# Patient Record
Sex: Male | Born: 1995 | Race: Black or African American | Hispanic: No | Marital: Married | Smoking: Never smoker
Health system: Southern US, Community
[De-identification: ages and names within clinical notes are randomized; demographics above are authoritative.]

## PROBLEM LIST (undated history)

## (undated) DIAGNOSIS — E669 Obesity, unspecified: Secondary | ICD-10-CM

---

## 2017-03-29 ENCOUNTER — Encounter (HOSPITAL_BASED_OUTPATIENT_CLINIC_OR_DEPARTMENT_OTHER): Payer: Self-pay | Admitting: *Deleted

## 2017-03-29 ENCOUNTER — Emergency Department (HOSPITAL_BASED_OUTPATIENT_CLINIC_OR_DEPARTMENT_OTHER)
Admission: EM | Admit: 2017-03-29 | Discharge: 2017-03-29 | Disposition: A | Discharge status: Home / Self Care | Payer: Self-pay | Attending: Emergency Medicine | Admitting: Emergency Medicine

## 2017-03-29 DIAGNOSIS — J0110 Acute frontal sinusitis, unspecified: Secondary | ICD-10-CM | POA: Insufficient documentation

## 2017-03-29 MED ORDER — ACETAMINOPHEN 500 MG PO TABS
1000.0000 mg | ORAL_TABLET | Freq: Once | ORAL | Status: AC
  Administered 2017-03-29: 1000 mg via ORAL
  Filled 2017-03-29: qty 2

## 2017-03-29 MED ORDER — AMOXICILLIN-POT CLAVULANATE 875-125 MG PO TABS: 1.0000 | ORAL_TABLET | Freq: Two times a day (BID) | ORAL | 0 refills | Status: DC

## 2017-03-29 MED FILL — AMOX-CLAV 875-125 MG TABLET: 875-125 | 7 days supply | Qty: 14 | Fill #0

## 2017-03-29 NOTE — ED Provider Notes (Signed)
[facility] Provider Note   CSN: [id number] Arrival date & time: 03/29/17  1606  By signing my name below, I, Linna Darner, attest that this documentation has been prepared under the direction and in the presence of Lilleigh Hechavarria, PA-C. Electronically Signed: Linna Darner, Scribe. 03/29/2017. 4:33 PM.  History   Chief Complaint Chief Complaint  Patient presents with  . Facial Pain  . Generalized Body Aches   The history is provided by the patient. No language interpreter was used.    HPI Comments: Frank Knight is a 21 y.o. male with a reported PMHx of seasonal allergies who presents to the Emergency Department complaining of intermittent headaches and malaise beginning four days ago. He reports some associated sinus pain/pressure, right ear pain, back pain, bilateral hip pain, intermittent left-sided chest pain radiating into the LUQ, nausea without vomiting, and an occasional cough. Girlfriend reports that patient "felt hot" earlier today but patient explicitly denies any fevers or chills. His chest pain is worse with movement of his left arm and palpation of his left chest. Patient has tried Tylenol, ibuprofen, OTC allergy relief, Dayquil, and Benadryl without any improvement of his symptoms. He does note that sleeping improves his symptoms mildly. Patient has been having normal bowel movements. He has been hydrating well. No known ill contacts with similar symptoms. He is a non-smoker and drinks alcohol occasionally. Denies illicit drug use. No daily medications. no recent antibiotic use. He denies neck stiffness/pain, rashes, abd pain, dysuria, hematuria, urinary frequency, or any other associated symptoms. No PCP.  History reviewed. No pertinent past medical history.  There are no active problems to display for this patient.   History reviewed. No pertinent surgical history.     Home Medications    Prior to Admission medications   Medication Sig Start Date End  Date Taking? Authorizing Provider  amoxicillin-clavulanate (AUGMENTIN) 875-125 MG tablet Take 1 tablet by mouth every 12 (twelve) hours. 03/29/17   Sidnie Swalley, PA-C    Family History No family history on file.  Social History Social History  Substance Use Topics  . Smoking status: Never Smoker  . Smokeless tobacco: Never Used  . Alcohol use No     Allergies   Patient has no known allergies.   Review of Systems Review of Systems  Constitutional: Positive for fatigue. Negative for chills and fever.  HENT: Positive for ear pain (R), sinus pain and sinus pressure.   Respiratory: Positive for cough.   Cardiovascular: Positive for chest pain.  Gastrointestinal: Positive for nausea. Negative for constipation, diarrhea and vomiting.  Genitourinary: Negative for dysuria, frequency and hematuria.  Musculoskeletal: Positive for back pain and myalgias.  Skin: Negative for rash.  Neurological: Positive for headaches.   Physical Exam Updated Vital Signs BP 133/66   Pulse (!) 101   Temp 100.3 F (37.9 C) (Oral)   Resp 20   Ht 5' 10.5" (1.791 m)   Wt (!) 142.9 kg (315 lb)   SpO2 97%   BMI 44.56 kg/m   Physical Exam  Constitutional: He is oriented to person, place, and time. He appears well-developed and well-nourished. No distress.  HENT:  Head: Normocephalic and atraumatic.  Right Ear: Tympanic membrane normal.  Left Ear: Tympanic membrane normal.  Nose: Right sinus exhibits maxillary sinus tenderness and frontal sinus tenderness. Left sinus exhibits maxillary sinus tenderness and frontal sinus tenderness.  Mouth/Throat: Uvula is midline, oropharynx is clear and moist and mucous membranes are normal.  Eyes: Conjunctivae and EOM are normal.  Neck: Neck supple. No tracheal deviation present.  Cardiovascular: Normal rate and regular rhythm.   Pulmonary/Chest: Effort normal and breath sounds normal. No respiratory distress. He exhibits tenderness.  TTP of the left side  torso from clavicle to umbilicus. No tenderness elsewhere.  Abdominal: Soft. There is no tenderness.  Musculoskeletal: Normal range of motion.  Neurological: He is alert and oriented to person, place, and time.  Skin: Skin is warm and dry.  Psychiatric: He has a normal mood and affect. His behavior is normal.  Nursing note and vitals reviewed.  ED Treatments / Results  Labs (all labs ordered are listed, but only abnormal results are displayed) Labs Reviewed - No data to display  EKG  EKG Interpretation None       Radiology No results found.  Procedures Procedures (including critical care time)  DIAGNOSTIC STUDIES: Oxygen Saturation is 97% on RA, normal by my interpretation.    COORDINATION OF CARE: 4:33 PM Discussed treatment plan with pt at bedside and pt agreed to plan.  Medications Ordered in ED Medications  acetaminophen (TYLENOL) tablet 1,000 mg (1,000 mg Oral Given 03/29/17 1620)     Initial Impression / Assessment and Plan / ED Course  I have reviewed the triage vital signs and the nursing notes.  Pertinent labs & imaging results that were available during my care of the patient were reviewed by me and considered in my medical decision making (see chart for details).     Pt presenting with 4 day h/o cough, nasal congestion, and sinus pressure. Fevers began yesterday. Pt with body aches and L sided torso pain. Physical exam consistent with history. No signs of neck stiffness, rash, OP/tonsiallr exudate, or abnormal lung sounds. Torso pain likely due to coughing and fever. Tenderness extends from torso to umbilicus, as such it is unlikely to be a cardiac or abd process. Pt with sinusitis. Considering new fevers and body aches, will rx abx. Pt appears safe for discharge. Return precautions given. Pt states he understands and agrees to plan.   Final Clinical Impressions(s) / ED Diagnoses   Final diagnoses:  Acute non-recurrent frontal sinusitis    New  Prescriptions Discharge Medication List as of 03/29/2017  4:40 PM    START taking these medications   Details  amoxicillin-clavulanate (AUGMENTIN) 875-125 MG tablet Take 1 tablet by mouth every 12 (twelve) hours., Starting Tue 03/29/2017, Print       I personally performed the services described in this documentation, which was scribed in my presence. The recorded information has been reviewed and is accurate.    Alveria ApleyCaccavale, Aleasha Fregeau, PA-C 03/30/17 [id number]    Pricilla LovelessGoldston, Scott, MD 03/30/17 [phone]

## 2017-03-29 NOTE — ED Triage Notes (Signed)
Facial pressure, cough and body aches x 4 days.

## 2017-03-29 NOTE — Discharge Instructions (Signed)
Take antibiotics as prescribed. You may use Tylenol or ibuprofen as needed for fever and body aches. Remain well hydrated. You may follow-up with [facility] in one week if your symptoms are not improving. Return to the emergency department if you develop worsening symptoms, rash, neck stiff necks, or any new or worsening symptoms.

## 2020-06-09 ENCOUNTER — Inpatient Hospital Stay (HOSPITAL_BASED_OUTPATIENT_CLINIC_OR_DEPARTMENT_OTHER)
Admission: EM | Admit: 2020-06-09 | Discharge: 2020-06-14 | DRG: 871 | Disposition: A | Discharge status: Home / Self Care | Payer: HRSA Program | Attending: Internal Medicine | Admitting: Internal Medicine

## 2020-06-09 ENCOUNTER — Other Ambulatory Visit: Payer: Self-pay

## 2020-06-09 ENCOUNTER — Emergency Department (HOSPITAL_BASED_OUTPATIENT_CLINIC_OR_DEPARTMENT_OTHER): Payer: Self-pay

## 2020-06-09 ENCOUNTER — Encounter (HOSPITAL_BASED_OUTPATIENT_CLINIC_OR_DEPARTMENT_OTHER): Payer: Self-pay | Admitting: Emergency Medicine

## 2020-06-09 DIAGNOSIS — U071 COVID-19: Secondary | ICD-10-CM

## 2020-06-09 DIAGNOSIS — R0602 Shortness of breath: Secondary | ICD-10-CM

## 2020-06-09 DIAGNOSIS — J1282 Pneumonia due to coronavirus disease 2019: Secondary | ICD-10-CM | POA: Diagnosis present

## 2020-06-09 DIAGNOSIS — A4189 Other specified sepsis: Principal | ICD-10-CM | POA: Diagnosis present

## 2020-06-09 DIAGNOSIS — E662 Morbid (severe) obesity with alveolar hypoventilation: Secondary | ICD-10-CM | POA: Diagnosis present

## 2020-06-09 DIAGNOSIS — Z6841 Body Mass Index (BMI) 40.0 and over, adult: Secondary | ICD-10-CM

## 2020-06-09 DIAGNOSIS — J440 Chronic obstructive pulmonary disease with acute lower respiratory infection: Secondary | ICD-10-CM | POA: Diagnosis present

## 2020-06-09 DIAGNOSIS — R531 Weakness: Secondary | ICD-10-CM

## 2020-06-09 DIAGNOSIS — J9601 Acute respiratory failure with hypoxia: Secondary | ICD-10-CM

## 2020-06-09 DIAGNOSIS — I10 Essential (primary) hypertension: Secondary | ICD-10-CM | POA: Diagnosis present

## 2020-06-09 HISTORY — DX: Obesity, unspecified: E66.9

## 2020-06-09 LAB — CBC WITH DIFFERENTIAL/PLATELET
Abs Immature Granulocytes: 0.03 10*3/uL (ref 0.00–0.07)
Basophils Absolute: 0 10*3/uL (ref 0.0–0.1)
Basophils Relative: 0 %
Eosinophils Absolute: 0 10*3/uL (ref 0.0–0.5)
Eosinophils Relative: 0 %
HCT: 44.4 % (ref 39.0–52.0)
Hemoglobin: 14.5 g/dL (ref 13.0–17.0)
Immature Granulocytes: 0 %
Lymphocytes Relative: 16 %
Lymphs Abs: 1.3 10*3/uL (ref 0.7–4.0)
MCH: 28.7 pg (ref 26.0–34.0)
MCHC: 32.7 g/dL (ref 30.0–36.0)
MCV: 87.7 fL (ref 80.0–100.0)
Monocytes Absolute: 0.4 10*3/uL (ref 0.1–1.0)
Monocytes Relative: 4 %
Neutro Abs: 6.6 10*3/uL (ref 1.7–7.7)
Neutrophils Relative %: 80 %
Platelets: 238 10*3/uL (ref 150–400)
RBC: 5.06 MIL/uL (ref 4.22–5.81)
RDW: 13.2 % (ref 11.5–15.5)
WBC: 8.3 10*3/uL (ref 4.0–10.5)
nRBC: 0 % (ref 0.0–0.2)

## 2020-06-09 LAB — COMPREHENSIVE METABOLIC PANEL
ALT: 45 U/L — ABNORMAL HIGH (ref 0–44)
AST: 47 U/L — ABNORMAL HIGH (ref 15–41)
Albumin: 3.9 g/dL (ref 3.5–5.0)
Alkaline Phosphatase: 121 U/L (ref 38–126)
Anion gap: 11 (ref 5–15)
BUN: 9 mg/dL (ref 6–20)
CO2: 26 mmol/L (ref 22–32)
Calcium: 8.8 mg/dL — ABNORMAL LOW (ref 8.9–10.3)
Chloride: 100 mmol/L (ref 98–111)
Creatinine, Ser: 1.33 mg/dL — ABNORMAL HIGH (ref 0.61–1.24)
GFR calc Af Amer: >60 mL/min (ref >60)
GFR calc non Af Amer: >60 mL/min (ref >60)
Glucose, Bld: 97 mg/dL (ref 70–99)
Potassium: 3.9 mmol/L (ref 3.5–5.1)
Sodium: 137 mmol/L (ref 135–145)
Total Bilirubin: 0.5 mg/dL (ref 0.3–1.2)
Total Protein: 8.4 g/dL — ABNORMAL HIGH (ref 6.5–8.1)

## 2020-06-09 LAB — D-DIMER, QUANTITATIVE: D-Dimer, Quant: 0.63 ug/mL-FEU — ABNORMAL HIGH (ref 0.00–0.50)

## 2020-06-09 LAB — PROTIME-INR
INR: 1 (ref 0.8–1.2)
Prothrombin Time: 12.5 seconds (ref 11.4–15.2)

## 2020-06-09 LAB — CBG MONITORING, ED: Glucose-Capillary: 103 mg/dL — ABNORMAL HIGH (ref 70–99)

## 2020-06-09 LAB — RESPIRATORY PANEL BY RT PCR (FLU A&B, COVID)
Influenza A by PCR: NEGATIVE
Influenza B by PCR: NEGATIVE
SARS Coronavirus 2 by RT PCR: POSITIVE — AB

## 2020-06-09 LAB — BRAIN NATRIURETIC PEPTIDE: B Natriuretic Peptide: 17.6 pg/mL (ref 0.0–100.0)

## 2020-06-09 LAB — LACTIC ACID, PLASMA: Lactic Acid, Venous: 1.3 mmol/L (ref 0.5–1.9)

## 2020-06-09 LAB — TROPONIN I (HIGH SENSITIVITY): Troponin I (High Sensitivity): 11 ng/L (ref <18)

## 2020-06-09 MED ORDER — SODIUM CHLORIDE 0.9 % IV SOLN
200.0000 mg | Freq: Once | INTRAVENOUS | Status: DC
  Filled 2020-06-09: qty 40

## 2020-06-09 MED ORDER — SODIUM CHLORIDE 0.9 % IV SOLN
100.0000 mg | Freq: Every day | INTRAVENOUS | Status: DC
  Administered 2020-06-10: 100 mg via INTRAVENOUS
  Filled 2020-06-09: qty 20

## 2020-06-09 MED ORDER — ENOXAPARIN SODIUM 40 MG/0.4ML ~~LOC~~ SOLN: 40.0000 mg | SUBCUTANEOUS | Status: DC

## 2020-06-09 MED ORDER — IBUPROFEN 200 MG PO TABS
600.0000 mg | ORAL_TABLET | Freq: Once | ORAL | Status: DC
  Administered 2020-06-09: 20:00:00 600 mg via ORAL
  Filled 2020-06-09: qty 1

## 2020-06-09 MED ORDER — ENOXAPARIN SODIUM 40 MG/0.4ML ~~LOC~~ SOLN
SUBCUTANEOUS | Status: AC
  Administered 2020-06-09: 40 mg via SUBCUTANEOUS
  Filled 2020-06-09: qty 0.4

## 2020-06-09 MED ORDER — ACETAMINOPHEN 500 MG PO TABS
1000.0000 mg | ORAL_TABLET | Freq: Once | ORAL | Status: AC
  Administered 2020-06-09: 20:00:00 1000 mg via ORAL
  Filled 2020-06-09: qty 2

## 2020-06-09 MED ORDER — IOHEXOL 350 MG/ML SOLN
100.0000 mL | Freq: Once | INTRAVENOUS | Status: AC | PRN
  Administered 2020-06-09: 22:00:00 100 mL via INTRAVENOUS

## 2020-06-09 MED ORDER — DEXAMETHASONE SODIUM PHOSPHATE 10 MG/ML IJ SOLN
6.0000 mg | INTRAMUSCULAR | Status: DC
  Administered 2020-06-09: 6 mg via INTRAVENOUS
  Filled 2020-06-09: qty 1

## 2020-06-09 NOTE — ED Triage Notes (Addendum)
States has been SOB and numbness/tingling to left side since he woke up at 1500. No other neuro deficit noted

## 2020-06-09 NOTE — ED Notes (Signed)
BC SET #1 OBTAINED FROM RT [facility]

## 2020-06-09 NOTE — ED Notes (Addendum)
RT requested With PT due to soft O2

## 2020-06-09 NOTE — ED Notes (Signed)
Placed in room 12, placed on cont cardiac monitor with cont POX and int NBP assessment

## 2020-06-09 NOTE — ED Notes (Signed)
ED Provider at bedside. 

## 2020-06-09 NOTE — ED Notes (Signed)
Pt states around 12 noon today his fingers began feeling numb

## 2020-06-09 NOTE — ED Notes (Signed)
BEFAST IS NEGATIVE

## 2020-06-09 NOTE — ED Provider Notes (Signed)
[facility] EMERGENCY DEPARTMENT Provider Note   CSN: [id number] Arrival date & time: 06/09/20  5176     History Chief Complaint  Patient presents with  . Shortness of Breath    fever    Frank Knight is a 24 y.o. male.  24yo M w/ h/o obesity who p/w shortness of breath and left sided weakness. At 12:00pm today, pt began feeling weakness and tingling on L side involving arm and leg. He took tylenol and went to sleep for a Jesiah Grismer while. He awakened feeling short of breath with chest tightness. He reports still feeling tingling/numbness on L side. He felt dizzy and weak when walking. He has had a cough and congestion this afternoon but no sore throat, loss of taste/smell, vomiting, diarrhea, or sick contacts. He is not vaccinated against COVID. He denies personal or fam hx of stroke or seizures. Denies headache, vision changes, or speech problems.  The history is provided by the patient.  Shortness of Breath      Past Medical History:  Diagnosis Date  . Obesity     Patient Active Problem List   Diagnosis Date Noted  . COVID-19 06/09/2020    History reviewed. No pertinent surgical history.     No family history on file.  Social History   Tobacco Use  . Smoking status: Never Smoker  . Smokeless tobacco: Never Used  Substance Use Topics  . Alcohol use: No  . Drug use: No    Home Medications Prior to Admission medications   Medication Sig Start Date End Date Taking? Authorizing Provider  amoxicillin-clavulanate (AUGMENTIN) 875-125 MG tablet Take 1 tablet by mouth every 12 (twelve) hours. 03/29/17   Caccavale, Sophia, PA-C    Allergies    Patient has no known allergies.  Review of Systems   Review of Systems  Respiratory: Positive for shortness of breath.    All other systems reviewed and are negative except that which was mentioned in HPI  Physical Exam Updated Vital Signs BP 98/67 (BP Location: Left Arm)   Pulse (!) 106   Temp 99.1 F (37.3 C)  (Oral)   Resp (!) 34   SpO2 92%   Physical Exam Vitals and nursing note reviewed.  Constitutional:      General: He is not in acute distress.    Appearance: He is well-developed. He is ill-appearing and diaphoretic. He is not toxic-appearing.     Comments: Awake, alert  HENT:     Head: Normocephalic and atraumatic.     Mouth/Throat:     Mouth: Mucous membranes are moist.     Pharynx: Oropharynx is clear.  Eyes:     Extraocular Movements: Extraocular movements intact.     Conjunctiva/sclera: Conjunctivae normal.     Pupils: Pupils are equal, round, and reactive to light.  Cardiovascular:     Rate and Rhythm: Regular rhythm. Tachycardia present.     Heart sounds: Normal heart sounds. No murmur heard.   Pulmonary:     Effort: Tachypnea present. No respiratory distress.     Breath sounds: Normal breath sounds.  Abdominal:     General: Bowel sounds are normal. There is no distension.     Palpations: Abdomen is soft.     Tenderness: There is no abdominal tenderness.  Musculoskeletal:     Cervical back: Neck supple.  Skin:    General: Skin is warm.  Neurological:     Mental Status: He is alert and oriented to person, place, and time.  Cranial Nerves: No cranial nerve deficit.     Motor: No abnormal muscle tone.     Deep Tendon Reflexes: Reflexes are normal and symmetric.     Comments: Fluent speech, normal finger-to-nose testing b/l, abnormal heel-to-shin testing LLE, + L pronator drift, no clonus 5/5 strength RUE, RLE 4+/5 strength LUE, LLE  Psychiatric:        Thought Content: Thought content normal.        Judgment: Judgment normal.     ED Results / Procedures / Treatments   Labs (all labs ordered are listed, but only abnormal results are displayed) Labs Reviewed  RESPIRATORY PANEL BY RT PCR (FLU A&B, COVID) - Abnormal; Notable for the following components:      Result Value   SARS Coronavirus 2 by RT PCR POSITIVE (*)    All other components within normal limits    COMPREHENSIVE METABOLIC PANEL - Abnormal; Notable for the following components:   Creatinine, Ser 1.33 (*)    Calcium 8.8 (*)    Total Protein 8.4 (*)    AST 47 (*)    ALT 45 (*)    All other components within normal limits  CBG MONITORING, ED - Abnormal; Notable for the following components:   Glucose-Capillary 103 (*)    All other components within normal limits  CULTURE, BLOOD (ROUTINE X 2)  CULTURE, BLOOD (ROUTINE X 2)  LACTIC ACID, PLASMA  CBC WITH DIFFERENTIAL/PLATELET  PROTIME-INR  BRAIN NATRIURETIC PEPTIDE  LACTIC ACID, PLASMA  URINALYSIS, ROUTINE W REFLEX MICROSCOPIC  D-DIMER, QUANTITATIVE (NOT AT [facility])  PROCALCITONIN  LACTATE DEHYDROGENASE  FERRITIN  TRIGLYCERIDES  FIBRINOGEN  C-REACTIVE PROTEIN  TROPONIN I (HIGH SENSITIVITY)  TROPONIN I (HIGH SENSITIVITY)    EKG EKG Interpretation  Date/Time:  Monday June 09 2020 19:26:52 EDT Ventricular Rate:  120 PR Interval:  136 QRS Duration: 80 QT Interval:  310 QTC Calculation: 438 R Axis:   23 Text Interpretation: Sinus tachycardia Cannot rule out Anterior infarct , age undetermined Abnormal ECG No previous ECGs available Confirmed by Frederick Peers [phone]) on 06/09/2020 7:51:39 PM   Radiology CT Angio Head W/Cm &/Or Wo Cm  Result Date: 06/09/2020 CLINICAL DATA:  Left-sided facial numbness and tingling EXAM: CT ANGIOGRAPHY HEAD AND NECK TECHNIQUE: Multidetector CT imaging of the head and neck was performed using the standard protocol during bolus administration of intravenous contrast. Multiplanar CT image reconstructions and MIPs were obtained to evaluate the vascular anatomy. Carotid stenosis measurements (when applicable) are obtained utilizing NASCET criteria, using the distal internal carotid diameter as the denominator. CONTRAST:  OMNIPAQUE IOHEXOL 350 MG/ML SOLN COMPARISON:  None. FINDINGS: CTA NECK FINDINGS SKELETON: There is no bony spinal canal stenosis. No lytic or blastic lesion. OTHER NECK:  Normal pharynx, larynx and major salivary glands. No cervical lymphadenopathy. Unremarkable thyroid gland. UPPER CHEST: Multiple peripheral predominant ground-glass opacities in the lung apices. AORTIC ARCH: There is no calcific atherosclerosis of the aortic arch. There is no aneurysm, dissection or hemodynamically significant stenosis of the visualized portion of the aorta. Conventional 3 vessel aortic branching pattern. The visualized proximal subclavian arteries are widely patent. RIGHT CAROTID SYSTEM: Normal without aneurysm, dissection or stenosis. LEFT CAROTID SYSTEM: Normal without aneurysm, dissection or stenosis. VERTEBRAL ARTERIES: Assessment limited by body habitus but appear to be patent to the skull base. CTA HEAD FINDINGS POSTERIOR CIRCULATION: --Vertebral arteries: Normal V4 segments. --Inferior cerebellar arteries: Normal. --Basilar artery: Normal. --Superior cerebellar arteries: Normal. --Posterior cerebral arteries (PCA): Normal. ANTERIOR CIRCULATION: --Intracranial internal carotid arteries:  Normal. --Anterior cerebral arteries (ACA): Normal. Both A1 segments are present. Patent anterior communicating artery (a-comm). --Middle cerebral arteries (MCA): Normal. VENOUS SINUSES: As permitted by contrast timing, patent. ANATOMIC VARIANTS: None Review of the MIP images confirms the above findings. IMPRESSION: 1. No emergent large vessel occlusion or high-grade stenosis of the intracranial arteries. 2. Multiple peripheral predominant ground-glass opacities in the lung apices, consistent with viral pneumonia. Electronically Signed   By: Deatra RobinsonKevin  Herman M.D.   On: 06/09/2020 22:53   CT Head Wo Contrast  Result Date: 06/09/2020 CLINICAL DATA:  Left-sided numbness and tingling. EXAM: CT HEAD WITHOUT CONTRAST TECHNIQUE: Contiguous axial images were obtained from the base of the skull through the vertex without intravenous contrast. COMPARISON:  None. FINDINGS: Brain: There is no mass, hemorrhage or  extra-axial collection. The size and configuration of the ventricles and extra-axial CSF spaces are normal. The brain parenchyma is normal, without acute or chronic infarction. Vascular: No abnormal hyperdensity of the major intracranial arteries or dural venous sinuses. No intracranial atherosclerosis. Skull: The visualized skull base, calvarium and extracranial soft tissues are normal. Sinuses/Orbits: No fluid levels or advanced mucosal thickening of the visualized paranasal sinuses. No mastoid or middle ear effusion. The orbits are normal. IMPRESSION: Normal head CT. Electronically Signed   By: Deatra RobinsonKevin  Herman M.D.   On: 06/09/2020 20:59   CT Angio Neck W and/or Wo Contrast  Result Date: 06/09/2020 CLINICAL DATA:  Left-sided facial numbness and tingling EXAM: CT ANGIOGRAPHY HEAD AND NECK TECHNIQUE: Multidetector CT imaging of the head and neck was performed using the standard protocol during bolus administration of intravenous contrast. Multiplanar CT image reconstructions and MIPs were obtained to evaluate the vascular anatomy. Carotid stenosis measurements (when applicable) are obtained utilizing NASCET criteria, using the distal internal carotid diameter as the denominator. CONTRAST:  100mL OMNIPAQUE IOHEXOL 350 MG/ML SOLN COMPARISON:  None. FINDINGS: CTA NECK FINDINGS SKELETON: There is no bony spinal canal stenosis. No lytic or blastic lesion. OTHER NECK: Normal pharynx, larynx and major salivary glands. No cervical lymphadenopathy. Unremarkable thyroid gland. UPPER CHEST: Multiple peripheral predominant ground-glass opacities in the lung apices. AORTIC ARCH: There is no calcific atherosclerosis of the aortic arch. There is no aneurysm, dissection or hemodynamically significant stenosis of the visualized portion of the aorta. Conventional 3 vessel aortic branching pattern. The visualized proximal subclavian arteries are widely patent. RIGHT CAROTID SYSTEM: Normal without aneurysm, dissection or stenosis.  LEFT CAROTID SYSTEM: Normal without aneurysm, dissection or stenosis. VERTEBRAL ARTERIES: Assessment limited by body habitus but appear to be patent to the skull base. CTA HEAD FINDINGS POSTERIOR CIRCULATION: --Vertebral arteries: Normal V4 segments. --Inferior cerebellar arteries: Normal. --Basilar artery: Normal. --Superior cerebellar arteries: Normal. --Posterior cerebral arteries (PCA): Normal. ANTERIOR CIRCULATION: --Intracranial internal carotid arteries: Normal. --Anterior cerebral arteries (ACA): Normal. Both A1 segments are present. Patent anterior communicating artery (a-comm). --Middle cerebral arteries (MCA): Normal. VENOUS SINUSES: As permitted by contrast timing, patent. ANATOMIC VARIANTS: None Review of the MIP images confirms the above findings. IMPRESSION: 1. No emergent large vessel occlusion or high-grade stenosis of the intracranial arteries. 2. Multiple peripheral predominant ground-glass opacities in the lung apices, consistent with viral pneumonia. Electronically Signed   By: Deatra RobinsonKevin  Herman M.D.   On: 06/09/2020 22:53   DG Chest Portable 1 View  Result Date: 06/09/2020 CLINICAL DATA:  Shortness of breath. EXAM: PORTABLE CHEST 1 VIEW COMPARISON:  None. FINDINGS: Low lung volumes. Upper normal heart size likely accentuated by portable technique and low lung volumes. Peribronchial thickening versus bronchovascular  crowding. No confluent consolidation. No pneumothorax or pleural effusion. No acute osseous abnormalities are seen. Soft tissue attenuation from habitus partially limits assessment. IMPRESSION: 1. Low lung volumes. Upper normal heart size likely accentuated by portable technique and low lung volumes. 2. Peribronchial thickening versus bronchovascular crowding. Electronically Signed   By: Narda Rutherford M.D.   On: 06/09/2020 20:24    Procedures Procedures (including critical care time) CRITICAL CARE Performed by: Ambrose Finland Colon Rueth   Total critical care time: 30  minutes  Critical care time was exclusive of separately billable procedures and treating other patients.  Critical care was necessary to treat or prevent imminent or life-threatening deterioration.  Critical care was time spent personally by me on the following activities: development of treatment plan with patient and/or surrogate as well as nursing, discussions with consultants, evaluation of patient's response to treatment, examination of patient, obtaining history from patient or surrogate, ordering and performing treatments and interventions, ordering and review of laboratory studies, ordering and review of radiographic studies, pulse oximetry and re-evaluation of patient's condition.  Medications Ordered in ED Medications  acetaminophen (TYLENOL) tablet 1,000 mg (1,000 mg Oral Given 06/09/20 1949)  iohexol (OMNIPAQUE) 350 MG/ML injection 100 mL (100 mLs Intravenous Contrast Given 06/09/20 2225)    ED Course  I have reviewed the triage vital signs and the nursing notes.  Pertinent labs & imaging results that were available during my care of the patient were reviewed by me and considered in my medical decision making (see chart for details).    MDM Rules/Calculators/A&P                          PT was ill appearing but not in severe respiratory distress on exam. Eventually needed 2L O2 to maintain sats.  Based on timeline that patient reports, he is outside of window for TPA therefore did not call a code stroke.  Head CT negative for acute process.  I discussed his symptoms with neurologist on-call, Dr. Derry Lory, who recommended CTA H/N to r/o LVO. If negative, he recommended MRI brain and c-spine with and without contrast that could be obtained non-emergently during hospitalization. CTAs negative for acute vascular process.  Labs show COVID-19 positive, normal CBC, normal trop and BNP, Cr 1.33, AST 47, ALT 45. I have added COVID inpatient labs. Discussed admission at [facility] with [facility], Dr.  Toniann Fail, and pt will be transferred for further care.  Frank Knight was evaluated in Emergency Department on 06/09/2020 for the symptoms described in the history of present illness. He was evaluated in the context of the global COVID-19 pandemic, which necessitated consideration that the patient might be at risk for infection with the SARS-CoV-2 virus that causes COVID-19. Institutional protocols and algorithms that pertain to the evaluation of patients at risk for COVID-19 are in a state of rapid change based on information released by regulatory bodies including the CDC and federal and state organizations. These policies and algorithms were followed during the patient's care in the ED.  Final Clinical Impression(s) / ED Diagnoses Final diagnoses:  COVID-19    Rx / DC Orders ED Discharge Orders    None       Zafira Munos, Ambrose Finland, MD 06/09/20 2330

## 2020-06-09 NOTE — ED Notes (Signed)
Lab notified of additional labs of Troponin and BNP

## 2020-06-09 NOTE — ED Notes (Signed)
ED MD notified of of symptoms. Will eval in in room

## 2020-06-10 DIAGNOSIS — U071 COVID-19: Secondary | ICD-10-CM | POA: Diagnosis not present

## 2020-06-10 DIAGNOSIS — J9601 Acute respiratory failure with hypoxia: Secondary | ICD-10-CM | POA: Diagnosis not present

## 2020-06-10 LAB — URINALYSIS, ROUTINE W REFLEX MICROSCOPIC
Bilirubin Urine: NEGATIVE
Glucose, UA: NEGATIVE mg/dL
Ketones, ur: NEGATIVE mg/dL
Leukocytes,Ua: NEGATIVE
Nitrite: NEGATIVE
Protein, ur: 30 mg/dL — AB
Specific Gravity, Urine: 1.01 (ref 1.005–1.030)
pH: 6.5 (ref 5.0–8.0)

## 2020-06-10 LAB — FERRITIN: Ferritin: 258 ng/mL (ref 24–336)

## 2020-06-10 LAB — FIBRINOGEN: Fibrinogen: 644 mg/dL — ABNORMAL HIGH (ref 210–475)

## 2020-06-10 LAB — CREATININE, SERUM
Creatinine, Ser: 1.19 mg/dL (ref 0.61–1.24)
GFR calc Af Amer: >60 mL/min (ref >60)
GFR calc non Af Amer: >60 mL/min (ref >60)

## 2020-06-10 LAB — LACTATE DEHYDROGENASE: LDH: 236 U/L — ABNORMAL HIGH (ref 98–192)

## 2020-06-10 LAB — TRIGLYCERIDES: Triglycerides: 116 mg/dL (ref <150)

## 2020-06-10 LAB — URINALYSIS, MICROSCOPIC (REFLEX)
Bacteria, UA: NONE SEEN
Squamous Epithelial / HPF: NONE SEEN (ref 0–5)
WBC, UA: NONE SEEN WBC/hpf (ref 0–5)

## 2020-06-10 LAB — C-REACTIVE PROTEIN: CRP: 11.8 mg/dL — ABNORMAL HIGH (ref <1.0)

## 2020-06-10 LAB — HIV ANTIBODY (ROUTINE TESTING W REFLEX): HIV Screen 4th Generation wRfx: NONREACTIVE

## 2020-06-10 LAB — LACTIC ACID, PLASMA: Lactic Acid, Venous: 0.8 mmol/L (ref 0.5–1.9)

## 2020-06-10 LAB — PROCALCITONIN: Procalcitonin: 0.2 ng/mL

## 2020-06-10 LAB — TROPONIN I (HIGH SENSITIVITY): Troponin I (High Sensitivity): 10 ng/L (ref <18)

## 2020-06-10 MED ORDER — ACETAMINOPHEN 325 MG PO TABS
650.0000 mg | ORAL_TABLET | Freq: Four times a day (QID) | ORAL | Status: DC | PRN
  Administered 2020-06-13 – 2020-06-14 (×3): 650 mg via ORAL
  Filled 2020-06-10 (×3): qty 2

## 2020-06-10 MED ORDER — ENOXAPARIN SODIUM 60 MG/0.6ML ~~LOC~~ SOLN
60.0000 mg | SUBCUTANEOUS | Status: DC
  Administered 2020-06-10: 60 mg via SUBCUTANEOUS
  Filled 2020-06-10: qty 0.6

## 2020-06-10 MED ORDER — IPRATROPIUM-ALBUTEROL 20-100 MCG/ACT IN AERS
1.0000 | INHALATION_SPRAY | Freq: Four times a day (QID) | RESPIRATORY_TRACT | Status: DC
  Administered 2020-06-10 – 2020-06-14 (×15): 1 via RESPIRATORY_TRACT
  Filled 2020-06-10: qty 4

## 2020-06-10 MED ORDER — SODIUM CHLORIDE 0.9 % IV SOLN
100.0000 mg | Freq: Every day | INTRAVENOUS | Status: AC
  Administered 2020-06-11 – 2020-06-14 (×4): 100 mg via INTRAVENOUS
  Filled 2020-06-10 (×4): qty 20

## 2020-06-10 MED ORDER — GUAIFENESIN-DM 100-10 MG/5ML PO SYRP
10.0000 mL | ORAL_SOLUTION | ORAL | Status: DC | PRN
  Administered 2020-06-11 – 2020-06-13 (×3): 10 mL via ORAL
  Filled 2020-06-10 (×3): qty 10

## 2020-06-10 MED ORDER — SODIUM CHLORIDE 0.9 % IV SOLN
100.0000 mg | Freq: Once | INTRAVENOUS | Status: AC
  Administered 2020-06-10: 100 mg via INTRAVENOUS

## 2020-06-10 MED ORDER — METHYLPREDNISOLONE SODIUM SUCC 40 MG IJ SOLR
40.0000 mg | Freq: Four times a day (QID) | INTRAMUSCULAR | Status: DC
  Administered 2020-06-10 – 2020-06-11 (×4): 40 mg via INTRAVENOUS
  Filled 2020-06-10 (×4): qty 1

## 2020-06-10 NOTE — ED Notes (Signed)
Cont to await for [employer] availability

## 2020-06-10 NOTE — Plan of Care (Signed)
Care plan intiated

## 2020-06-10 NOTE — H&P (Signed)
History and Physical    Frank Knight NLZ:[id number] DOB: 01/24/1996 DOA: 06/09/2020  PCP: Patient, No Pcp Per (Confirm with patient/family/NH records and if not entered, this has to be entered at [facility] point of entry) Patient coming from:Home  I have personally briefly reviewed patient's old medical records in [facility]  Chief Complaint: SOB  HPI: Frank Knight is a 24 y.o. male with medical history significant of morbid obesity, presented with new onset symptoms of fingers and toes numbness and shortness of breath.  Patient works as Medical laboratory scientific officer, no obvious COVID-19 contact.  He was never vaccinated for COVID-19.  Yesterday afternoon or suddenly he started to feel numbness of all 10 fingers and 10 toes, at that point he did not feel any shortness of breath.  He decided to take some ibuprofen and went to take a nap.  When he woke up about 1 hour later he started to feel extremely short of breath and tightness-like chest pain retrosternal, no wheezing no feeling of muscle aching joint pain, no loss of taste or diarrhea.  His wife at home has had several days of headache but no breathing symptoms. he came to [facility] ED. ED Course: Fever of 102.9, and was found to have tachypneic and O2 saturation borderline low but stabilized on 2 L.  CT head negative for acute finding, but found multi focal infiltrates in bilateral lungs.  COVID-19 came back positive.  Initially patient complained to ED staff about numbness was only on the left side of toes and fingers, CT head and CT angiogram ordered according to neurology, which showed no large vessel stenosis or occlusions.  Review of Systems: As per HPI otherwise 14 point review of systems negative.    Past Medical History:  Diagnosis Date  . Obesity     History reviewed. No pertinent surgical history.   reports that he has never smoked. He has never used smokeless tobacco. He reports that he does not drink alcohol and does not use  drugs.  No Known Allergies  No family history on file.    Prior to Admission medications   Medication Sig Start Date End Date Taking? Authorizing Provider  amoxicillin-clavulanate (AUGMENTIN) 875-125 MG tablet Take 1 tablet by mouth every 12 (twelve) hours. 03/29/17   Alveria Apley, PA-C    Physical Exam: Vitals:   06/10/20 0900 06/10/20 1000 06/10/20 1125 06/10/20 1422  BP: (!) 139/103  (!) 130/91 (!) 143/66  Pulse: (!) 102 (!) 102 (!) 104 100  Resp: (!) 32  (!) 26 20  Temp:   98.9 F (37.2 C) (!) 100.4 F (38 C)  TempSrc:   Oral Oral  SpO2: 95% 94% 96%     Constitutional: NAD, calm, comfortable Vitals:   06/10/20 0900 06/10/20 1000 06/10/20 1125 06/10/20 1422  BP: (!) 139/103  (!) 130/91 (!) 143/66  Pulse: (!) 102 (!) 102 (!) 104 100  Resp: (!) 32  (!) 26 20  Temp:   98.9 F (37.2 C) (!) 100.4 F (38 C)  TempSrc:   Oral Oral  SpO2: 95% 94% 96%    Eyes: PERRL, lids and conjunctivae normal ENMT: Mucous membranes are moist. Posterior pharynx clear of any exudate or lesions.Normal dentition.  Neck: normal, supple, no masses, no thyromegaly Respiratory: clear to auscultation bilaterally, no wheezing, no crackles.  Increased respiratory effort. No accessory muscle use.  Cardiovascular: Regular rate and rhythm, no murmurs / rubs / gallops. No extremity edema. 2+ pedal pulses. No carotid bruits.  Abdomen:  no tenderness, no masses palpated. No hepatosplenomegaly. Bowel sounds positive.  Musculoskeletal: no clubbing / cyanosis. No joint deformity upper and lower extremities. Good ROM, no contractures. Normal muscle tone.  Skin: no rashes, lesions, ulcers. No induration Neurologic: CN 2-12 grossly intact. Sensation intact, DTR normal. Strength 5/5 in all 4.  Psychiatric: Normal judgment and insight. Alert and oriented x 3. Normal mood.     Labs on Admission: I have personally reviewed following labs and imaging studies  CBC: Recent Labs  Lab 06/09/20 2014  WBC 8.3   NEUTROABS 6.6  HGB 14.5  HCT 44.4  MCV 87.7  PLT 238   Basic Metabolic Panel: Recent Labs  Lab 06/09/20 2014 06/09/20 2344  NA 137  --   K 3.9  --   CL 100  --   CO2 26  --   GLUCOSE 97  --   BUN 9  --   CREATININE 1.33* 1.19  CALCIUM 8.8*  --    GFR: CrCl cannot be calculated (Unknown ideal weight.). Liver Function Tests: Recent Labs  Lab 06/09/20 2014  AST 47*  ALT 45*  ALKPHOS 121  BILITOT 0.5  PROT 8.4*  ALBUMIN 3.9   No results for input(s): LIPASE, AMYLASE in the last 168 hours. No results for input(s): AMMONIA in the last 168 hours. Coagulation Profile: Recent Labs  Lab 06/09/20 2014  INR 1.0   Cardiac Enzymes: No results for input(s): CKTOTAL, CKMB, CKMBINDEX, TROPONINI in the last 168 hours. BNP (last 3 results) No results for input(s): PROBNP in the last 8760 hours. HbA1C: No results for input(s): HGBA1C in the last 72 hours. CBG: Recent Labs  Lab 06/09/20 2001  GLUCAP 103*   Lipid Profile: Recent Labs    06/09/20 2329  TRIG 116   Thyroid Function Tests: No results for input(s): TSH, T4TOTAL, FREET4, T3FREE, THYROIDAB in the last 72 hours. Anemia Panel: Recent Labs    06/09/20 2329  FERRITIN 258   Urine analysis:    Component Value Date/Time   COLORURINE YELLOW 06/10/2020 0215   APPEARANCEUR CLEAR 06/10/2020 0215   LABSPEC 1.010 06/10/2020 0215   PHURINE 6.5 06/10/2020 0215   GLUCOSEU NEGATIVE 06/10/2020 0215   HGBUR TRACE (A) 06/10/2020 0215   BILIRUBINUR NEGATIVE 06/10/2020 0215   KETONESUR NEGATIVE 06/10/2020 0215   PROTEINUR 30 (A) 06/10/2020 0215   NITRITE NEGATIVE 06/10/2020 0215   LEUKOCYTESUR NEGATIVE 06/10/2020 0215    Radiological Exams on Admission: CT Angio Head W/Cm &/Or Wo Cm  Result Date: 06/09/2020 CLINICAL DATA:  Left-sided facial numbness and tingling EXAM: CT ANGIOGRAPHY HEAD AND NECK TECHNIQUE: Multidetector CT imaging of the head and neck was performed using the standard protocol during bolus  administration of intravenous contrast. Multiplanar CT image reconstructions and MIPs were obtained to evaluate the vascular anatomy. Carotid stenosis measurements (when applicable) are obtained utilizing NASCET criteria, using the distal internal carotid diameter as the denominator. CONTRAST:  OMNIPAQUE IOHEXOL 350 MG/ML SOLN COMPARISON:  None. FINDINGS: CTA NECK FINDINGS SKELETON: There is no bony spinal canal stenosis. No lytic or blastic lesion. OTHER NECK: Normal pharynx, larynx and major salivary glands. No cervical lymphadenopathy. Unremarkable thyroid gland. UPPER CHEST: Multiple peripheral predominant ground-glass opacities in the lung apices. AORTIC ARCH: There is no calcific atherosclerosis of the aortic arch. There is no aneurysm, dissection or hemodynamically significant stenosis of the visualized portion of the aorta. Conventional 3 vessel aortic branching pattern. The visualized proximal subclavian arteries are widely patent. RIGHT CAROTID SYSTEM: Normal without aneurysm,  dissection or stenosis. LEFT CAROTID SYSTEM: Normal without aneurysm, dissection or stenosis. VERTEBRAL ARTERIES: Assessment limited by body habitus but appear to be patent to the skull base. CTA HEAD FINDINGS POSTERIOR CIRCULATION: --Vertebral arteries: Normal V4 segments. --Inferior cerebellar arteries: Normal. --Basilar artery: Normal. --Superior cerebellar arteries: Normal. --Posterior cerebral arteries (PCA): Normal. ANTERIOR CIRCULATION: --Intracranial internal carotid arteries: Normal. --Anterior cerebral arteries (ACA): Normal. Both A1 segments are present. Patent anterior communicating artery (a-comm). --Middle cerebral arteries (MCA): Normal. VENOUS SINUSES: As permitted by contrast timing, patent. ANATOMIC VARIANTS: None Review of the MIP images confirms the above findings. IMPRESSION: 1. No emergent large vessel occlusion or high-grade stenosis of the intracranial arteries. 2. Multiple peripheral predominant  ground-glass opacities in the lung apices, consistent with viral pneumonia. Electronically Signed   By: Deatra Robinson M.D.   On: 06/09/2020 22:53   CT Head Wo Contrast  Result Date: 06/09/2020 CLINICAL DATA:  Left-sided numbness and tingling. EXAM: CT HEAD WITHOUT CONTRAST TECHNIQUE: Contiguous axial images were obtained from the base of the skull through the vertex without intravenous contrast. COMPARISON:  None. FINDINGS: Brain: There is no mass, hemorrhage or extra-axial collection. The size and configuration of the ventricles and extra-axial CSF spaces are normal. The brain parenchyma is normal, without acute or chronic infarction. Vascular: No abnormal hyperdensity of the major intracranial arteries or dural venous sinuses. No intracranial atherosclerosis. Skull: The visualized skull base, calvarium and extracranial soft tissues are normal. Sinuses/Orbits: No fluid levels or advanced mucosal thickening of the visualized paranasal sinuses. No mastoid or middle ear effusion. The orbits are normal. IMPRESSION: Normal head CT. Electronically Signed   By: Deatra Robinson M.D.   On: 06/09/2020 20:59   CT Angio Neck W and/or Wo Contrast  Result Date: 06/09/2020 CLINICAL DATA:  Left-sided facial numbness and tingling EXAM: CT ANGIOGRAPHY HEAD AND NECK TECHNIQUE: Multidetector CT imaging of the head and neck was performed using the standard protocol during bolus administration of intravenous contrast. Multiplanar CT image reconstructions and MIPs were obtained to evaluate the vascular anatomy. Carotid stenosis measurements (when applicable) are obtained utilizing NASCET criteria, using the distal internal carotid diameter as the denominator. CONTRAST:  OMNIPAQUE IOHEXOL 350 MG/ML SOLN COMPARISON:  None. FINDINGS: CTA NECK FINDINGS SKELETON: There is no bony spinal canal stenosis. No lytic or blastic lesion. OTHER NECK: Normal pharynx, larynx and major salivary glands. No cervical lymphadenopathy.  Unremarkable thyroid gland. UPPER CHEST: Multiple peripheral predominant ground-glass opacities in the lung apices. AORTIC ARCH: There is no calcific atherosclerosis of the aortic arch. There is no aneurysm, dissection or hemodynamically significant stenosis of the visualized portion of the aorta. Conventional 3 vessel aortic branching pattern. The visualized proximal subclavian arteries are widely patent. RIGHT CAROTID SYSTEM: Normal without aneurysm, dissection or stenosis. LEFT CAROTID SYSTEM: Normal without aneurysm, dissection or stenosis. VERTEBRAL ARTERIES: Assessment limited by body habitus but appear to be patent to the skull base. CTA HEAD FINDINGS POSTERIOR CIRCULATION: --Vertebral arteries: Normal V4 segments. --Inferior cerebellar arteries: Normal. --Basilar artery: Normal. --Superior cerebellar arteries: Normal. --Posterior cerebral arteries (PCA): Normal. ANTERIOR CIRCULATION: --Intracranial internal carotid arteries: Normal. --Anterior cerebral arteries (ACA): Normal. Both A1 segments are present. Patent anterior communicating artery (a-comm). --Middle cerebral arteries (MCA): Normal. VENOUS SINUSES: As permitted by contrast timing, patent. ANATOMIC VARIANTS: None Review of the MIP images confirms the above findings. IMPRESSION: 1. No emergent large vessel occlusion or high-grade stenosis of the intracranial arteries. 2. Multiple peripheral predominant ground-glass opacities in the lung apices, consistent with viral pneumonia.  Electronically Signed   By: Deatra RobinsonKevin  Herman M.D.   On: 06/09/2020 22:53   DG Chest Portable 1 View  Result Date: 06/09/2020 CLINICAL DATA:  Shortness of breath. EXAM: PORTABLE CHEST 1 VIEW COMPARISON:  None. FINDINGS: Low lung volumes. Upper normal heart size likely accentuated by portable technique and low lung volumes. Peribronchial thickening versus bronchovascular crowding. No confluent consolidation. No pneumothorax or pleural effusion. No acute osseous abnormalities are  seen. Soft tissue attenuation from habitus partially limits assessment. IMPRESSION: 1. Low lung volumes. Upper normal heart size likely accentuated by portable technique and low lung volumes. 2. Peribronchial thickening versus bronchovascular crowding. Electronically Signed   By: Narda RutherfordMelanie  Sanford M.D.   On: 06/09/2020 20:24    EKG: Independently reviewed.  Sinus tachycardia  Assessment/Plan Active Problems:   COVID-19  (please populate well all problems here in Problem List. (For example, if patient is on BP meds at home and you resume or decide to hold them, it is a problem that needs to be her. Same for CAD, COPD, HLD and so on)  Sepsis secondary to COVID-19 pneumonia POA, which resolved now -Sepsis evidenced by fever, tachypnea, source days COVID-19 infection  -Remdesivir and steroid started -Breathing treatment and other supportive measures -Discussed with patient regarding prone position, patient says he has chronic back pain and hard to adopt prone position. -Trend markers  Paresthesia -No significant hypoxia on arrival yesterday, but patient was found to be very tachypneic raised a concern about over ventilation/respiratory alkalosis, will start symptoms have resolved after O2 supplement.  And patient today told me that the feeling of numbness was on hold for extremities. -As above, will not order MRI as planned yesterday.  Morbid obesity -Diet and exercise  DVT prophylaxis: Lovenox Code Status: Full code Family Communication: None at bedside Disposition Plan: Expect more than 2 midnight hospital stay to treat Consults called: Neurology Admission status: Telemetry admission   Emeline GeneralPing T Carron Jaggi MD [facility] Pager [phone]  06/10/2020, 5:21 PM

## 2020-06-10 NOTE — ED Notes (Addendum)
Pt keys given to pt significant other

## 2020-06-11 DIAGNOSIS — J9601 Acute respiratory failure with hypoxia: Secondary | ICD-10-CM | POA: Diagnosis not present

## 2020-06-11 DIAGNOSIS — U071 COVID-19: Secondary | ICD-10-CM | POA: Diagnosis not present

## 2020-06-11 LAB — CBC WITH DIFFERENTIAL/PLATELET
Abs Immature Granulocytes: 0.01 10*3/uL (ref 0.00–0.07)
Basophils Absolute: 0 10*3/uL (ref 0.0–0.1)
Basophils Relative: 0 %
Eosinophils Absolute: 0 10*3/uL (ref 0.0–0.5)
Eosinophils Relative: 0 %
HCT: 43.6 % (ref 39.0–52.0)
Hemoglobin: 14 g/dL (ref 13.0–17.0)
Immature Granulocytes: 0 %
Lymphocytes Relative: 10 %
Lymphs Abs: 0.8 10*3/uL (ref 0.7–4.0)
MCH: 28.3 pg (ref 26.0–34.0)
MCHC: 32.1 g/dL (ref 30.0–36.0)
MCV: 88.1 fL (ref 80.0–100.0)
Monocytes Absolute: 0.2 10*3/uL (ref 0.1–1.0)
Monocytes Relative: 3 %
Neutro Abs: 7.2 10*3/uL (ref 1.7–7.7)
Neutrophils Relative %: 87 %
Platelets: 250 10*3/uL (ref 150–400)
RBC: 4.95 MIL/uL (ref 4.22–5.81)
RDW: 13.4 % (ref 11.5–15.5)
WBC: 8.3 10*3/uL (ref 4.0–10.5)
nRBC: 0 % (ref 0.0–0.2)

## 2020-06-11 LAB — C-REACTIVE PROTEIN: CRP: 8.5 mg/dL — ABNORMAL HIGH (ref <1.0)

## 2020-06-11 LAB — COMPREHENSIVE METABOLIC PANEL
ALT: 44 U/L (ref 0–44)
AST: 54 U/L — ABNORMAL HIGH (ref 15–41)
Albumin: 3.4 g/dL — ABNORMAL LOW (ref 3.5–5.0)
Alkaline Phosphatase: 110 U/L (ref 38–126)
Anion gap: 11 (ref 5–15)
BUN: 12 mg/dL (ref 6–20)
CO2: 23 mmol/L (ref 22–32)
Calcium: 9.3 mg/dL (ref 8.9–10.3)
Chloride: 103 mmol/L (ref 98–111)
Creatinine, Ser: 1.01 mg/dL (ref 0.61–1.24)
GFR calc Af Amer: >60 mL/min (ref >60)
GFR calc non Af Amer: >60 mL/min (ref >60)
Glucose, Bld: 169 mg/dL — ABNORMAL HIGH (ref 70–99)
Potassium: 4 mmol/L (ref 3.5–5.1)
Sodium: 137 mmol/L (ref 135–145)
Total Bilirubin: 0.8 mg/dL (ref 0.3–1.2)
Total Protein: 7.9 g/dL (ref 6.5–8.1)

## 2020-06-11 LAB — D-DIMER, QUANTITATIVE: D-Dimer, Quant: 0.54 ug/mL-FEU — ABNORMAL HIGH (ref 0.00–0.50)

## 2020-06-11 LAB — MAGNESIUM: Magnesium: 2.2 mg/dL (ref 1.7–2.4)

## 2020-06-11 LAB — GLUCOSE, CAPILLARY
Glucose-Capillary: 133 mg/dL — ABNORMAL HIGH (ref 70–99)
Glucose-Capillary: 118 mg/dL — ABNORMAL HIGH (ref 70–99)
Glucose-Capillary: 282 mg/dL — ABNORMAL HIGH (ref 70–99)

## 2020-06-11 LAB — HEMOGLOBIN A1C
Hgb A1c MFr Bld: 5.8 % — ABNORMAL HIGH (ref 4.8–5.6)
Mean Plasma Glucose: 119.76 mg/dL

## 2020-06-11 MED ORDER — INSULIN ASPART 100 UNIT/ML ~~LOC~~ SOLN
0.0000 [IU] | Freq: Three times a day (TID) | SUBCUTANEOUS | Status: DC
  Administered 2020-06-11 – 2020-06-12 (×2): 3 [IU] via SUBCUTANEOUS
  Administered 2020-06-12 – 2020-06-13 (×5): 2 [IU] via SUBCUTANEOUS

## 2020-06-11 MED ORDER — INSULIN ASPART 100 UNIT/ML ~~LOC~~ SOLN: 0.0000 [IU] | Freq: Every day | SUBCUTANEOUS | Status: DC

## 2020-06-11 MED ORDER — METHYLPREDNISOLONE SODIUM SUCC 125 MG IJ SOLR
60.0000 mg | Freq: Two times a day (BID) | INTRAMUSCULAR | Status: DC
  Administered 2020-06-11 – 2020-06-12 (×3): 60 mg via INTRAVENOUS
  Filled 2020-06-11 (×4): qty 2

## 2020-06-11 MED ORDER — ENOXAPARIN SODIUM 100 MG/ML ~~LOC~~ SOLN
90.0000 mg | SUBCUTANEOUS | Status: DC
  Administered 2020-06-11 – 2020-06-13 (×3): 90 mg via SUBCUTANEOUS
  Filled 2020-06-11: qty 1
  Filled 2020-06-11 (×3): qty 0.9
  Filled 2020-06-11: qty 1

## 2020-06-11 MED ORDER — AMLODIPINE BESYLATE 10 MG PO TABS
10.0000 mg | ORAL_TABLET | Freq: Every day | ORAL | Status: DC
  Administered 2020-06-11 – 2020-06-14 (×4): 10 mg via ORAL
  Filled 2020-06-11 (×4): qty 1

## 2020-06-11 NOTE — Plan of Care (Signed)
Patient is currently resting in bed. Denies pain. OOB ambulating to BR. VSS. Remains on 3L [place]. While sleeping patients oxygen went as low as 88%. Possible sleep apena. Call bell within reach.   Problem: Education: Goal: Knowledge of risk factors and measures for prevention of condition will improve Outcome: Progressing   Problem: Coping: Goal: Psychosocial and spiritual needs will be supported Outcome: Progressing   Problem: Respiratory: Goal: Will maintain a patent airway Outcome: Progressing Goal: Complications related to the disease process, condition or treatment will be avoided or minimized Outcome: Progressing   Problem: Education: Goal: Knowledge of General Education information will improve Description: Including pain rating scale, medication(s)/side effects and non-pharmacologic comfort measures Outcome: Progressing   Problem: Health Behavior/Discharge Planning: Goal: Ability to manage health-related needs will improve Outcome: Progressing   Problem: Clinical Measurements: Goal: Ability to maintain clinical measurements within normal limits will improve Outcome: Progressing Goal: Will remain free from infection Outcome: Progressing Goal: Diagnostic test results will improve Outcome: Progressing Goal: Respiratory complications will improve Outcome: Progressing Goal: Cardiovascular complication will be avoided Outcome: Progressing   Problem: Activity: Goal: Risk for activity intolerance will decrease Outcome: Progressing   Problem: Nutrition: Goal: Adequate nutrition will be maintained Outcome: Progressing   Problem: Coping: Goal: Level of anxiety will decrease Outcome: Progressing   Problem: Elimination: Goal: Will not experience complications related to bowel motility Outcome: Progressing Goal: Will not experience complications related to urinary retention Outcome: Progressing   Problem: Pain Managment: Goal: General experience of comfort will  improve Outcome: Progressing   Problem: Safety: Goal: Ability to remain free from injury will improve Outcome: Progressing   Problem: Skin Integrity: Goal: Risk for impaired skin integrity will decrease Outcome: Progressing

## 2020-06-11 NOTE — Progress Notes (Signed)
ANTICOAGULATION CONSULT NOTE  Pharmacy Consult for lovenox Indication: VTE prophylaxis  Labs: Recent Labs    06/09/20 2014 06/09/20 2344 06/11/20 0925  HGB 14.5  --  14.0  HCT 44.4  --  43.6  PLT 238  --  250  LABPROT 12.5  --   --   INR 1.0  --   --   CREATININE 1.33* 1.19 1.01    Assessment: 23 yom presenting COVID-19 positive. Pharmacy consulted to dose Lovenox for VTE prophylaxis. Previously on Lovenox 60mg  Inez q24h this admit - last dose 9/28 at 2111. CBC wnl, SCr stable 1.01. D-dimer <5 stable. Patient is not on anticoagulation PTA. No active bleed issues reported. Weight updated this admit as 183.3 kg.  Goal of Therapy:  VTE prevention Monitor platelets by anticoagulation protocol: Yes   Plan:  Adjust Lovenox to 90mg  (0.5mg /kg) [place] q24h Monitor CBC, s/sx bleeding  [id number], PharmD, BCPS Clinical Pharmacist 06/11/2020 12:18 PM

## 2020-06-11 NOTE — Progress Notes (Signed)
PROGRESS NOTE                                                                                                                                                                                                             Patient Demographics:    Frank Knight, is a 24 y.o. male, DOB - 1996-05-14, ZOX:[id number]  Outpatient Primary MD for the patient is Patient, No Pcp Per    LOS - 1  Admit date - 06/09/2020    Chief Complaint  Patient presents with  . Shortness of Breath    fever       Brief Narrative -  Frank Knight is a 24 y.o. male with medical history significant of morbid obesity, presented with new onset symptoms of fingers and toes numbness, low-grade fevers and shortness of breath.  In the ER he was diagnosed with acute hypoxic respiratory failure due to COVID-19 pneumonia, CT head, CTA head and neck were unremarkable.  Paresthesias resolved.  He was admitted for treatment of COVID-19 pneumonia.   Subjective:    Frank Knight today has, No headache, No chest pain, No abdominal pain - No Nausea, No new weakness tingling or numbness, much improved shortness of breath.   Assessment  & Plan :     1. Acute Hypoxic Resp. Failure due to Acute Covid 19 Viral Pneumonitis during the ongoing 2020 Covid 19 Pandemic  - he is unfortunately not vaccinated, has moderate parenchymal lung injury, has been placed on steroids and Remdesivir which will be continued, monitor closely.  Encouraged the patient to sit up in chair in the daytime use I-S and flutter valve for pulmonary toiletry and then prone in bed when at night.  Will advance activity and titrate down oxygen as possible.   SpO2: 91 % O2 Flow Rate (L/min): 3 L/min  Recent Labs  Lab 06/09/20 1930 06/09/20 2014 06/09/20 2329 06/09/20 2344 06/11/20 0925  WBC  --  8.3  --   --  8.3  PLT  --  238  --   --  250  CRP  --   --  11.8*  --  8.5*  BNP  --  17.6  --   --   --     DDIMER  --   --  0.63*  --  0.54*  PROCALCITON  --   --  0.20  --   --   AST  --  47*  --   --  54*  ALT  --  45*  --   --  44  ALKPHOS  --  121  --   --  110  BILITOT  --  0.5  --   --  0.8  ALBUMIN  --  3.9  --   --  3.4*  INR  --  1.0  --   --   --   LATICACIDVEN  --  1.3  --  0.8  --   SARSCOV2NAA POSITIVE*  --   --   --   --     2.  Morbid obesity.  BMI greater than 40.  Follow with PCP for weight loss.  3.  Hypertension.  Placed on Norvasc will monitor.   Condition - Guarded  Family Communication  :  Wife [phone] on 06/11/20  Code Status :  Full  Consults  :  None  Procedures  :     CT Head, CTA Head and Neck - non acute  PUD Prophylaxis : None  Disposition Plan  :    Status is: Inpatient  Remains inpatient appropriate because:IV treatments appropriate due to intensity of illness or inability to take PO   Dispo: The patient is from: Home              Anticipated d/c is to: Home              Anticipated d/c date is: > 3 days              Patient currently is not medically stable to d/c.   DVT Prophylaxis  :  Lovenox   Lab Results  Component Value Date   PLT 250 06/11/2020    Diet :  Diet Order            Diet regular Room service appropriate? Yes; Fluid consistency: Thin  Diet effective now                  Inpatient Medications  Scheduled Meds: . enoxaparin (LOVENOX) injection  60 mg Subcutaneous Q24H  . Ipratropium-Albuterol  1 puff Inhalation Q6H  . methylPREDNISolone (SOLU-MEDROL) injection  60 mg Intravenous Q12H   Continuous Infusions: . remdesivir 100 mg in NS 100 mL 100 mg (06/11/20 1011)   PRN Meds:.acetaminophen, guaiFENesin-dextromethorphan  Antibiotics  :    Anti-infectives (From admission, onward)   Start     Dose/Rate Route Frequency Ordered Stop   06/11/20 1000  remdesivir 100 mg in sodium chloride 0.9 % 100 mL IVPB        100 mg 200 mL/hr over 30 Minutes Intravenous Daily 06/10/20 0022 06/15/20 0959   06/10/20  1000  remdesivir 100 mg in sodium chloride 0.9 % 100 mL IVPB  Status:  Discontinued       "Followed by" Linked Group Details   100 mg 200 mL/hr over 30 Minutes Intravenous Daily 06/09/20 2333 06/10/20 0021   06/10/20 0030  remdesivir 100 mg in sodium chloride 0.9 % 100 mL IVPB        100 mg 200 mL/hr over 30 Minutes Intravenous  Once 06/10/20 0022 06/10/20 0209   06/09/20 2345  remdesivir 200 mg in sodium chloride 0.9% 250 mL IVPB  Status:  Discontinued       "Followed by" Linked Group Details   200 mg 580 mL/hr over 30 Minutes Intravenous Once 06/09/20 2333 06/10/20 0021  Time Spent in minutes  30   Minard Millirons M.D on 06/11/2020 at [address] go to [email or web address] - password [facility]  [facility] -  Office  [phone]     See all Orders from today for further details    Objective:   Vitals:   06/11/20 0421 06/11/20 0500 06/11/20 0754 06/11/20 0800  BP: 137/74  (!) 139/99   Pulse: 88  89   Resp: 20  (!) 27 (!) 22  Temp: 98.5 F (36.9 C)  97.6 F (36.4 C)   TempSrc: Oral  Oral   SpO2: 90% 94% 91%     Wt Readings from Last 3 Encounters:  03/29/17 (!) 142.9 kg     Intake/Output Summary (Last 24 hours) at 06/11/2020 1211 Last data filed at 06/11/2020 0836 Gross per 24 hour  Intake 640 ml  Output 1325 ml  Net -685 ml     Physical Exam  Awake Alert, No new F.N deficits, Normal affect [place].AT,PERRAL Supple Neck,No JVD, No cervical lymphadenopathy appriciated.  Symmetrical Chest wall movement, Good air movement bilaterally, CTAB RRR,No Gallops,Rubs or new Murmurs, No Parasternal Heave +ve B.Sounds, Abd Soft, No tenderness, No organomegaly appriciated, No rebound - guarding or rigidity. No Cyanosis, Clubbing or edema, No new Rash or bruise     Data Review:    CBC Recent Labs  Lab 06/09/20 2014 06/11/20 0925  WBC 8.3 8.3  HGB 14.5 14.0  HCT 44.4 43.6  PLT 238 250  MCV 87.7 88.1  MCH 28.7 28.3  MCHC 32.7 32.1  RDW 13.2 13.4    LYMPHSABS 1.3 0.8  MONOABS 0.4 0.2  EOSABS 0.0 0.0  BASOSABS 0.0 0.0    Recent Labs  Lab 06/09/20 2014 06/09/20 2329 06/09/20 2344 06/11/20 0925  NA 137  --   --  137  K 3.9  --   --  4.0  CL 100  --   --  103  CO2 26  --   --  23  GLUCOSE 97  --   --  169*  BUN 9  --   --  12  CREATININE 1.33*  --  1.19 1.01  CALCIUM 8.8*  --   --  9.3  AST 47*  --   --  54*  ALT 45*  --   --  44  ALKPHOS 121  --   --  110  BILITOT 0.5  --   --  0.8  ALBUMIN 3.9  --   --  3.4*  MG  --   --   --  2.2  CRP  --  11.8*  --  8.5*  DDIMER  --  0.63*  --  0.54*  PROCALCITON  --  0.20  --   --   LATICACIDVEN 1.3  --  0.8  --   INR 1.0  --   --   --   BNP 17.6  --   --   --     ------------------------------------------------------------------------------------------------------------------ Recent Labs    06/09/20 2329  TRIG 116    No results found for: HGBA1C ------------------------------------------------------------------------------------------------------------------ No results for input(s): TSH, T4TOTAL, T3FREE, THYROIDAB in the last 72 hours.  Invalid input(s): FREET3  Cardiac Enzymes No results for input(s): CKMB, TROPONINI, MYOGLOBIN in the last 168 hours.  Invalid input(s): CK ------------------------------------------------------------------------------------------------------------------    Component Value Date/Time   BNP 17.6 06/09/2020 2014    Micro Results Recent Results (from the past 240 hour(s))  Respiratory Panel by RT PCR (  Flu A&B, Covid) - Nasopharyngeal Swab     Status: Abnormal   Collection Time: 06/09/20  7:30 PM   Specimen: Nasopharyngeal Swab  Result Value Ref Range Status   SARS Coronavirus 2 by RT PCR POSITIVE (A) NEGATIVE Final    Comment: RESULT CALLED TO, READ BACK BY AND VERIFIED WITH: KELLY NEAL RN @2045  06/09/2020 OLSONM (NOTE) SARS-CoV-2 target nucleic acids are DETECTED.  SARS-CoV-2 RNA is generally detectable in upper respiratory  specimens  during the acute phase of infection. Positive results are indicative of the presence of the identified virus, but do not rule out bacterial infection or co-infection with other pathogens not detected by the test. Clinical correlation with patient history and other diagnostic information is necessary to determine patient infection status. The expected result is Negative.  Fact Sheet for Patients:  06/11/2020  Fact Sheet for Healthcare Providers: https://www.moore.com/  This test is not yet approved or cleared by the https://www.young.biz/ FDA and  has been authorized for detection and/or diagnosis of SARS-CoV-2 by FDA under an Emergency Use Authorization (EUA).  This EUA will remain in effect (meaning this test can  be used) for the duration of  the COVID-19 declaration under Section 564(b)(1) of the Act, 21 U.S.C. section 360bbb-3(b)(1), unless the authorization is terminated or revoked sooner.      Influenza A by PCR NEGATIVE NEGATIVE Final   Influenza B by PCR NEGATIVE NEGATIVE Final    Comment: (NOTE) The Xpert Xpress SARS-CoV-2/FLU/RSV assay is intended as an aid in  the diagnosis of influenza from Nasopharyngeal swab specimens and  should not be used as a sole basis for treatment. Nasal washings and  aspirates are unacceptable for Xpert Xpress SARS-CoV-2/FLU/RSV  testing.  Fact Sheet for Patients: [place]  Fact Sheet for Healthcare Providers: https://www.moore.com/  This test is not yet approved or cleared by the https://www.young.biz/ FDA and  has been authorized for detection and/or diagnosis of SARS-CoV-2 by  FDA under an Emergency Use Authorization (EUA). This EUA will remain  in effect (meaning this test can be used) for the duration of the  Covid-19 declaration under Section 564(b)(1) of the Act, 21  U.S.C. section 360bbb-3(b)(1), unless the authorization is    terminated or revoked. Performed at [facility], [address] [place], [place]   Culture, blood (routine x 2)     Status: None (Preliminary result)   Collection Time: 06/09/20  8:14 PM   Specimen: BLOOD RIGHT ARM  Result Value Ref Range Status   Specimen Description   Final    BLOOD RIGHT ARM Performed at [facility], [address] [place], [place]    Special Requests   Final    BOTTLES DRAWN AEROBIC AND ANAEROBIC Blood Culture adequate volume Performed at [facility], [address] [place], [place]    Culture   Final    NO GROWTH 2 DAYS Performed at [facility], [address], [place], [place]    Report Status PENDING  Incomplete    Radiology Reports CT Angio Head W/Cm &/Or Wo Cm  Result Date: 06/09/2020 CLINICAL DATA:  Left-sided facial numbness and tingling EXAM: CT ANGIOGRAPHY HEAD AND NECK TECHNIQUE: Multidetector CT imaging of the head and neck was performed using the standard protocol during bolus administration of intravenous contrast. Multiplanar CT image reconstructions and MIPs were obtained to evaluate the vascular anatomy. Carotid stenosis measurements (when applicable) are obtained utilizing  NASCET criteria, using the distal internal carotid diameter as the denominator. CONTRAST:  OMNIPAQUE IOHEXOL 350 MG/ML SOLN COMPARISON:  None. FINDINGS: CTA NECK FINDINGS SKELETON: There is no bony spinal canal stenosis. No lytic or blastic lesion. OTHER NECK: Normal pharynx, larynx and major salivary glands. No cervical lymphadenopathy. Unremarkable thyroid gland. UPPER CHEST: Multiple peripheral predominant ground-glass opacities in the lung apices. AORTIC ARCH: There is no calcific atherosclerosis of the aortic arch. There is no aneurysm, dissection or hemodynamically significant stenosis of the visualized portion of the aorta. Conventional 3 vessel aortic branching pattern. The visualized  proximal subclavian arteries are widely patent. RIGHT CAROTID SYSTEM: Normal without aneurysm, dissection or stenosis. LEFT CAROTID SYSTEM: Normal without aneurysm, dissection or stenosis. VERTEBRAL ARTERIES: Assessment limited by body habitus but appear to be patent to the skull base. CTA HEAD FINDINGS POSTERIOR CIRCULATION: --Vertebral arteries: Normal V4 segments. --Inferior cerebellar arteries: Normal. --Basilar artery: Normal. --Superior cerebellar arteries: Normal. --Posterior cerebral arteries (PCA): Normal. ANTERIOR CIRCULATION: --Intracranial internal carotid arteries: Normal. --Anterior cerebral arteries (ACA): Normal. Both A1 segments are present. Patent anterior communicating artery (a-comm). --Middle cerebral arteries (MCA): Normal. VENOUS SINUSES: As permitted by contrast timing, patent. ANATOMIC VARIANTS: None Review of the MIP images confirms the above findings. IMPRESSION: 1. No emergent large vessel occlusion or high-grade stenosis of the intracranial arteries. 2. Multiple peripheral predominant ground-glass opacities in the lung apices, consistent with viral pneumonia. Electronically Signed   By: Deatra Robinson M.D.   On: 06/09/2020 22:53   CT Head Wo Contrast  Result Date: 06/09/2020 CLINICAL DATA:  Left-sided numbness and tingling. EXAM: CT HEAD WITHOUT CONTRAST TECHNIQUE: Contiguous axial images were obtained from the base of the skull through the vertex without intravenous contrast. COMPARISON:  None. FINDINGS: Brain: There is no mass, hemorrhage or extra-axial collection. The size and configuration of the ventricles and extra-axial CSF spaces are normal. The brain parenchyma is normal, without acute or chronic infarction. Vascular: No abnormal hyperdensity of the major intracranial arteries or dural venous sinuses. No intracranial atherosclerosis. Skull: The visualized skull base, calvarium and extracranial soft tissues are normal. Sinuses/Orbits: No fluid levels or advanced mucosal  thickening of the visualized paranasal sinuses. No mastoid or middle ear effusion. The orbits are normal. IMPRESSION: Normal head CT. Electronically Signed   By: Deatra Robinson M.D.   On: 06/09/2020 20:59   CT Angio Neck W and/or Wo Contrast  Result Date: 06/09/2020 CLINICAL DATA:  Left-sided facial numbness and tingling EXAM: CT ANGIOGRAPHY HEAD AND NECK TECHNIQUE: Multidetector CT imaging of the head and neck was performed using the standard protocol during bolus administration of intravenous contrast. Multiplanar CT image reconstructions and MIPs were obtained to evaluate the vascular anatomy. Carotid stenosis measurements (when applicable) are obtained utilizing NASCET criteria, using the distal internal carotid diameter as the denominator. CONTRAST:  OMNIPAQUE IOHEXOL 350 MG/ML SOLN COMPARISON:  None. FINDINGS: CTA NECK FINDINGS SKELETON: There is no bony spinal canal stenosis. No lytic or blastic lesion. OTHER NECK: Normal pharynx, larynx and major salivary glands. No cervical lymphadenopathy. Unremarkable thyroid gland. UPPER CHEST: Multiple peripheral predominant ground-glass opacities in the lung apices. AORTIC ARCH: There is no calcific atherosclerosis of the aortic arch. There is no aneurysm, dissection or hemodynamically significant stenosis of the visualized portion of the aorta. Conventional 3 vessel aortic branching pattern. The visualized proximal subclavian arteries are widely patent. RIGHT CAROTID SYSTEM: Normal without aneurysm, dissection or stenosis. LEFT CAROTID SYSTEM: Normal without aneurysm, dissection or stenosis. VERTEBRAL ARTERIES: Assessment limited by  body habitus but appear to be patent to the skull base. CTA HEAD FINDINGS POSTERIOR CIRCULATION: --Vertebral arteries: Normal V4 segments. --Inferior cerebellar arteries: Normal. --Basilar artery: Normal. --Superior cerebellar arteries: Normal. --Posterior cerebral arteries (PCA): Normal. ANTERIOR CIRCULATION: --Intracranial  internal carotid arteries: Normal. --Anterior cerebral arteries (ACA): Normal. Both A1 segments are present. Patent anterior communicating artery (a-comm). --Middle cerebral arteries (MCA): Normal. VENOUS SINUSES: As permitted by contrast timing, patent. ANATOMIC VARIANTS: None Review of the MIP images confirms the above findings. IMPRESSION: 1. No emergent large vessel occlusion or high-grade stenosis of the intracranial arteries. 2. Multiple peripheral predominant ground-glass opacities in the lung apices, consistent with viral pneumonia. Electronically Signed   By: Deatra RobinsonKevin  Herman M.D.   On: 06/09/2020 22:53   DG Chest Portable 1 View  Result Date: 06/09/2020 CLINICAL DATA:  Shortness of breath. EXAM: PORTABLE CHEST 1 VIEW COMPARISON:  None. FINDINGS: Low lung volumes. Upper normal heart size likely accentuated by portable technique and low lung volumes. Peribronchial thickening versus bronchovascular crowding. No confluent consolidation. No pneumothorax or pleural effusion. No acute osseous abnormalities are seen. Soft tissue attenuation from habitus partially limits assessment. IMPRESSION: 1. Low lung volumes. Upper normal heart size likely accentuated by portable technique and low lung volumes. 2. Peribronchial thickening versus bronchovascular crowding. Electronically Signed   By: Narda RutherfordMelanie  Sanford M.D.   On: 06/09/2020 20:24

## 2020-06-12 DIAGNOSIS — J9601 Acute respiratory failure with hypoxia: Secondary | ICD-10-CM

## 2020-06-12 DIAGNOSIS — U071 COVID-19: Secondary | ICD-10-CM

## 2020-06-12 LAB — CBC WITH DIFFERENTIAL/PLATELET
Abs Immature Granulocytes: 0.12 10*3/uL — ABNORMAL HIGH (ref 0.00–0.07)
Basophils Absolute: 0 10*3/uL (ref 0.0–0.1)
Basophils Relative: 0 %
Eosinophils Absolute: 0 10*3/uL (ref 0.0–0.5)
Eosinophils Relative: 0 %
HCT: 46.1 % (ref 39.0–52.0)
Hemoglobin: 14.4 g/dL (ref 13.0–17.0)
Immature Granulocytes: 1 %
Lymphocytes Relative: 5 %
Lymphs Abs: 0.9 10*3/uL (ref 0.7–4.0)
MCH: 28.1 pg (ref 26.0–34.0)
MCHC: 31.2 g/dL (ref 30.0–36.0)
MCV: 89.9 fL (ref 80.0–100.0)
Monocytes Absolute: 0.6 10*3/uL (ref 0.1–1.0)
Monocytes Relative: 4 %
Neutro Abs: 14.5 10*3/uL — ABNORMAL HIGH (ref 1.7–7.7)
Neutrophils Relative %: 90 %
Platelets: 295 10*3/uL (ref 150–400)
RBC: 5.13 MIL/uL (ref 4.22–5.81)
RDW: 13.4 % (ref 11.5–15.5)
WBC: 16.1 10*3/uL — ABNORMAL HIGH (ref 4.0–10.5)
nRBC: 0 % (ref 0.0–0.2)

## 2020-06-12 LAB — COMPREHENSIVE METABOLIC PANEL
ALT: 107 U/L — ABNORMAL HIGH (ref 0–44)
AST: 109 U/L — ABNORMAL HIGH (ref 15–41)
Albumin: 3.5 g/dL (ref 3.5–5.0)
Alkaline Phosphatase: 123 U/L (ref 38–126)
Anion gap: 12 (ref 5–15)
BUN: 12 mg/dL (ref 6–20)
CO2: 24 mmol/L (ref 22–32)
Calcium: 9.4 mg/dL (ref 8.9–10.3)
Chloride: 100 mmol/L (ref 98–111)
Creatinine, Ser: 1.05 mg/dL (ref 0.61–1.24)
GFR calc Af Amer: >60 mL/min (ref >60)
GFR calc non Af Amer: >60 mL/min (ref >60)
Glucose, Bld: 134 mg/dL — ABNORMAL HIGH (ref 70–99)
Potassium: 4.2 mmol/L (ref 3.5–5.1)
Sodium: 136 mmol/L (ref 135–145)
Total Bilirubin: 0.7 mg/dL (ref 0.3–1.2)
Total Protein: 8 g/dL (ref 6.5–8.1)

## 2020-06-12 LAB — D-DIMER, QUANTITATIVE: D-Dimer, Quant: 0.29 ug/mL-FEU (ref 0.00–0.50)

## 2020-06-12 LAB — GLUCOSE, CAPILLARY
Glucose-Capillary: 137 mg/dL — ABNORMAL HIGH (ref 70–99)
Glucose-Capillary: 134 mg/dL — ABNORMAL HIGH (ref 70–99)
Glucose-Capillary: 151 mg/dL — ABNORMAL HIGH (ref 70–99)
Glucose-Capillary: 152 mg/dL — ABNORMAL HIGH (ref 70–99)

## 2020-06-12 LAB — C-REACTIVE PROTEIN: CRP: 5.7 mg/dL — ABNORMAL HIGH (ref <1.0)

## 2020-06-12 LAB — MAGNESIUM: Magnesium: 2.3 mg/dL (ref 1.7–2.4)

## 2020-06-12 LAB — BRAIN NATRIURETIC PEPTIDE: B Natriuretic Peptide: 105.8 pg/mL — ABNORMAL HIGH (ref 0.0–100.0)

## 2020-06-12 MED ORDER — LOSARTAN POTASSIUM 50 MG PO TABS
50.0000 mg | ORAL_TABLET | Freq: Every day | ORAL | Status: DC
  Administered 2020-06-12 – 2020-06-14 (×3): 50 mg via ORAL
  Filled 2020-06-12 (×4): qty 1

## 2020-06-12 MED ORDER — CARVEDILOL 6.25 MG PO TABS
6.2500 mg | ORAL_TABLET | Freq: Two times a day (BID) | ORAL | Status: DC
  Administered 2020-06-12 (×2): 6.25 mg via ORAL
  Filled 2020-06-12 (×2): qty 1

## 2020-06-12 MED ORDER — METOPROLOL TARTRATE 5 MG/5ML IV SOLN
5.0000 mg | Freq: Once | INTRAVENOUS | Status: AC
  Administered 2020-06-12: 5 mg via INTRAVENOUS
  Filled 2020-06-12: qty 5

## 2020-06-12 NOTE — Progress Notes (Signed)
PROGRESS NOTE                                                                                                                                                                                                             Patient Demographics:    Frank Knight, is a 24 y.o. male, DOB - Oct 04, 1995, ESP:[id number]  Outpatient Primary MD for the patient is Patient, No Pcp Per    LOS - 2  Admit date - 06/09/2020    Chief Complaint  Patient presents with  . Shortness of Breath    fever       Brief Narrative -  Frank Knight is a 24 y.o. male with medical history significant of morbid obesity, presented with new onset symptoms of fingers and toes numbness, low-grade fevers and shortness of breath.  In the ER he was diagnosed with acute hypoxic respiratory failure due to COVID-19 pneumonia, CT head, CTA head and neck were unremarkable.  Paresthesias resolved.  He was admitted for treatment of COVID-19 pneumonia.   Subjective:   Patient in bed, appears comfortable, denies any headache, no fever, no chest pain or pressure, much improved shortness of breath , no abdominal pain. No focal weakness.  Patient wants all monitors and leads to be removed.   Assessment  & Plan :     1. Acute Hypoxic Resp. Failure due to Acute Covid 19 Viral Pneumonitis during the ongoing 2020 Covid 19 Pandemic  - he is unfortunately not vaccinated, has moderate parenchymal lung injury, has been placed on steroids and Remdesivir which will be continued, monitor closely.  Encouraged the patient to sit up in chair in the daytime use I-S and flutter valve for pulmonary toiletry and then prone in bed when at night.  Will advance activity and titrate down oxygen as possible.   SpO2: 90 % O2 Flow Rate (L/min): 2 L/min  Recent Labs  Lab 06/09/20 1930 06/09/20 2014 06/09/20 2329 06/09/20 2344 06/11/20 0925  WBC  --  8.3  --   --  8.3  PLT  --  238  --   --  250    CRP  --   --  11.8*  --  8.5*  BNP  --  17.6  --   --   --   DDIMER  --   --  0.63*  --  0.54*  PROCALCITON  --   --  0.20  --   --   AST  --  47*  --   --  54*  ALT  --  45*  --   --  44  ALKPHOS  --  121  --   --  110  BILITOT  --  0.5  --   --  0.8  ALBUMIN  --  3.9  --   --  3.4*  INR  --  1.0  --   --   --   LATICACIDVEN  --  1.3  --  0.8  --   SARSCOV2NAA POSITIVE*  --   --   --   --     2.  Morbid obesity.  BMI greater than 40.  Follow with PCP for weight loss.  Likely has underlying OSA and OHS as well outpatient sleep study recommended.  3.  Hypertension.  Patient on Norvasc, ACE inhibitor, and Coreg for better control.   Condition - Guarded  Family Communication  :  Wife [phone] on 06/11/20  Code Status :  Full  Consults  :  None  Procedures  :     CT Head, CTA Head and Neck - non acute  PUD Prophylaxis : None  Disposition Plan  :    Status is: Inpatient  Remains inpatient appropriate because:IV treatments appropriate due to intensity of illness or inability to take PO   Dispo: The patient is from: Home              Anticipated d/c is to: Home              Anticipated d/c date is: > 3 days              Patient currently is not medically stable to d/c.   DVT Prophylaxis  :  Lovenox   Lab Results  Component Value Date   PLT 250 06/11/2020    Diet :  Diet Order            Diet regular Room service appropriate? Yes; Fluid consistency: Thin  Diet effective now                  Inpatient Medications  Scheduled Meds: . amLODipine  10 mg Oral Daily  . carvedilol  6.25 mg Oral BID WC  . enoxaparin (LOVENOX) injection  90 mg Subcutaneous Q24H  . insulin aspart  0-15 Units Subcutaneous TID WC  . insulin aspart  0-5 Units Subcutaneous QHS  . Ipratropium-Albuterol  1 puff Inhalation Q6H  . losartan  50 mg Oral Daily  . methylPREDNISolone (SOLU-MEDROL) injection  60 mg Intravenous Q12H   Continuous Infusions: . remdesivir 100 mg in NS 100  mL 100 mg (06/12/20 0906)   PRN Meds:.acetaminophen, guaiFENesin-dextromethorphan  Antibiotics  :    Anti-infectives (From admission, onward)   Start     Dose/Rate Route Frequency Ordered Stop   06/11/20 1000  remdesivir 100 mg in sodium chloride 0.9 % 100 mL IVPB        100 mg 200 mL/hr over 30 Minutes Intravenous Daily 06/10/20 0022 06/15/20 0959   06/10/20 1000  remdesivir 100 mg in sodium chloride 0.9 % 100 mL IVPB  Status:  Discontinued       "Followed by" Linked Group Details   100 mg 200 mL/hr over 30 Minutes Intravenous Daily 06/09/20 2333 06/10/20 0021   06/10/20 0030  remdesivir 100 mg in sodium chloride 0.9 %  100 mL IVPB        100 mg 200 mL/hr over 30 Minutes Intravenous  Once 06/10/20 0022 06/10/20 0209   06/09/20 2345  remdesivir 200 mg in sodium chloride 0.9% 250 mL IVPB  Status:  Discontinued       "Followed by" Linked Group Details   200 mg 580 mL/hr over 30 Minutes Intravenous Once 06/09/20 2333 06/10/20 0021       Time Spent in minutes  30   Susa Raring M.D on 06/12/2020 at 9:42 AM  To page go to [email or web address] - password [email or web address]  [facility] -  Office  [phone]     See all Orders from today for further details    Objective:   Vitals:   06/12/20 0505 06/12/20 0511 06/12/20 0841 06/12/20 0900  BP: (!) 146/98  (!) 153/120   Pulse: 93  99   Resp: (!) 33 (!) 24    Temp: 98.5 F (36.9 C)  98.7 F (37.1 C)   TempSrc: Oral  Oral   SpO2: 92%   90%  Weight:        Wt Readings from Last 3 Encounters:  06/11/20 (!) 183.3 kg  03/29/17 (!) 142.9 kg     Intake/Output Summary (Last 24 hours) at 06/12/2020 0942 Last data filed at 06/12/2020 0841 Gross per 24 hour  Intake 620 ml  Output 900 ml  Net -280 ml     Physical Exam  Awake Alert, No new F.N deficits, Normal affect [place].AT,PERRAL Supple Neck,No JVD, No cervical lymphadenopathy appriciated.  Symmetrical Chest wall movement, Good air movement bilaterally, CTAB RRR,No Gallops, Rubs  or new Murmurs, No Parasternal Heave +ve B.Sounds, Abd Soft, No tenderness, No organomegaly appriciated, No rebound - guarding or rigidity. No Cyanosis, Clubbing or edema, No new Rash or bruise     Data Review:    CBC Recent Labs  Lab 06/09/20 2014 06/11/20 0925  WBC 8.3 8.3  HGB 14.5 14.0  HCT 44.4 43.6  PLT 238 250  MCV 87.7 88.1  MCH 28.7 28.3  MCHC 32.7 32.1  RDW 13.2 13.4  LYMPHSABS 1.3 0.8  MONOABS 0.4 0.2  EOSABS 0.0 0.0  BASOSABS 0.0 0.0    Recent Labs  Lab 06/09/20 2014 06/09/20 2329 06/09/20 2344 06/11/20 0925  NA 137  --   --  137  K 3.9  --   --  4.0  CL 100  --   --  103  CO2 26  --   --  23  GLUCOSE 97  --   --  169*  BUN 9  --   --  12  CREATININE 1.33*  --  1.19 1.01  CALCIUM 8.8*  --   --  9.3  AST 47*  --   --  54*  ALT 45*  --   --  44  ALKPHOS 121  --   --  110  BILITOT 0.5  --   --  0.8  ALBUMIN 3.9  --   --  3.4*  MG  --   --   --  2.2  CRP  --  11.8*  --  8.5*  DDIMER  --  0.63*  --  0.54*  PROCALCITON  --  0.20  --   --   LATICACIDVEN 1.3  --  0.8  --   INR 1.0  --   --   --   HGBA1C  --   --   --  5.8*  BNP 17.6  --   --   --     ------------------------------------------------------------------------------------------------------------------  Recent Labs    06/09/20 2329  TRIG 116    Lab Results  Component Value Date   HGBA1C 5.8 (H) 06/11/2020   ------------------------------------------------------------------------------------------------------------------ No results for input(s): TSH, T4TOTAL, T3FREE, THYROIDAB in the last 72 hours.  Invalid input(s): FREET3  Cardiac Enzymes No results for input(s): CKMB, TROPONINI, MYOGLOBIN in the last 168 hours.  Invalid input(s): CK ------------------------------------------------------------------------------------------------------------------    Component Value Date/Time   BNP 17.6 06/09/2020 2014    Micro Results Recent Results (from the past 240 hour(s))    Respiratory Panel by RT PCR (Flu A&B, Covid) - Nasopharyngeal Swab     Status: Abnormal   Collection Time: 06/09/20  7:30 PM   Specimen: Nasopharyngeal Swab  Result Value Ref Range Status   SARS Coronavirus 2 by RT PCR POSITIVE (A) NEGATIVE Final    Comment: RESULT CALLED TO, READ BACK BY AND VERIFIED WITH: KELLY NEAL RN  06/09/2020 OLSONM (NOTE) SARS-CoV-2 target nucleic acids are DETECTED.  SARS-CoV-2 RNA is generally detectable in upper respiratory specimens  during the acute phase of infection. Positive results are indicative of the presence of the identified virus, but do not rule out bacterial infection or co-infection with other pathogens not detected by the test. Clinical correlation with patient history and other diagnostic information is necessary to determine patient infection status. The expected result is Negative.  Fact Sheet for Patients:  https://www.moore.com/  Fact Sheet for Healthcare Providers: https://www.young.biz/  This test is not yet approved or cleared by the [place] FDA and  has been authorized for detection and/or diagnosis of SARS-CoV-2 by FDA under an Emergency Use Authorization (EUA).  This EUA will remain in effect (meaning this test can  be used) for the duration of  the COVID-19 declaration under Section 564(b)(1) of the Act, 21 U.S.C. section 360bbb-3(b)(1), unless the authorization is terminated or revoked sooner.      Influenza A by PCR NEGATIVE NEGATIVE Final   Influenza B by PCR NEGATIVE NEGATIVE Final    Comment: (NOTE) The Xpert Xpress SARS-CoV-2/FLU/RSV assay is intended as an aid in  the diagnosis of influenza from Nasopharyngeal swab specimens and  should not be used as a sole basis for treatment. Nasal washings and  aspirates are unacceptable for Xpert Xpress SARS-CoV-2/FLU/RSV  testing.  Fact Sheet for Patients: https://www.moore.com/  Fact Sheet for  Healthcare Providers: https://www.young.biz/  This test is not yet approved or cleared by the [place] FDA and  has been authorized for detection and/or diagnosis of SARS-CoV-2 by  FDA under an Emergency Use Authorization (EUA). This EUA will remain  in effect (meaning this test can be used) for the duration of the  Covid-19 declaration under Section 564(b)(1) of the Act, 21  U.S.C. section 360bbb-3(b)(1), unless the authorization is  terminated or revoked. Performed at [facility], [address] [place], [place] [address]   Culture, blood (routine x 2)     Status: None (Preliminary result)   Collection Time: 06/09/20  8:14 PM   Specimen: BLOOD RIGHT ARM  Result Value Ref Range Status   Specimen Description   Final    BLOOD RIGHT ARM Performed at [facility], [address] [place], [place] [address]    Special Requests   Final    BOTTLES DRAWN AEROBIC AND ANAEROBIC Blood Culture adequate volume Performed at [facility], [address] [place], [place] [address]    Culture   Final    NO GROWTH  2 DAYS Performed at [facility], [address] [place], [place] [address]    Report Status PENDING  Incomplete    Radiology Reports CT Angio Head W/Cm &/Or Wo Cm  Result Date: 06/09/2020 CLINICAL DATA:  Left-sided facial numbness and tingling EXAM: CT ANGIOGRAPHY HEAD AND NECK TECHNIQUE: Multidetector CT imaging of the head and neck was performed using the standard protocol during bolus administration of intravenous contrast. Multiplanar CT image reconstructions and MIPs were obtained to evaluate the vascular anatomy. Carotid stenosis measurements (when applicable) are obtained utilizing NASCET criteria, using the distal internal carotid diameter as the denominator. CONTRAST:  OMNIPAQUE IOHEXOL 350 MG/ML SOLN COMPARISON:  None. FINDINGS: CTA NECK FINDINGS SKELETON: There is no bony spinal canal stenosis. No lytic  or blastic lesion. OTHER NECK: Normal pharynx, larynx and major salivary glands. No cervical lymphadenopathy. Unremarkable thyroid gland. UPPER CHEST: Multiple peripheral predominant ground-glass opacities in the lung apices. AORTIC ARCH: There is no calcific atherosclerosis of the aortic arch. There is no aneurysm, dissection or hemodynamically significant stenosis of the visualized portion of the aorta. Conventional 3 vessel aortic branching pattern. The visualized proximal subclavian arteries are widely patent. RIGHT CAROTID SYSTEM: Normal without aneurysm, dissection or stenosis. LEFT CAROTID SYSTEM: Normal without aneurysm, dissection or stenosis. VERTEBRAL ARTERIES: Assessment limited by body habitus but appear to be patent to the skull base. CTA HEAD FINDINGS POSTERIOR CIRCULATION: --Vertebral arteries: Normal V4 segments. --Inferior cerebellar arteries: Normal. --Basilar artery: Normal. --Superior cerebellar arteries: Normal. --Posterior cerebral arteries (PCA): Normal. ANTERIOR CIRCULATION: --Intracranial internal carotid arteries: Normal. --Anterior cerebral arteries (ACA): Normal. Both A1 segments are present. Patent anterior communicating artery (a-comm). --Middle cerebral arteries (MCA): Normal. VENOUS SINUSES: As permitted by contrast timing, patent. ANATOMIC VARIANTS: None Review of the MIP images confirms the above findings. IMPRESSION: 1. No emergent large vessel occlusion or high-grade stenosis of the intracranial arteries. 2. Multiple peripheral predominant ground-glass opacities in the lung apices, consistent with viral pneumonia. Electronically Signed   By: Deatra Robinson M.D.   On: 06/09/2020 22:53   CT Head Wo Contrast  Result Date: 06/09/2020 CLINICAL DATA:  Left-sided numbness and tingling. EXAM: CT HEAD WITHOUT CONTRAST TECHNIQUE: Contiguous axial images were obtained from the base of the skull through the vertex without intravenous contrast. COMPARISON:  None. FINDINGS: Brain: There is  no mass, hemorrhage or extra-axial collection. The size and configuration of the ventricles and extra-axial CSF spaces are normal. The brain parenchyma is normal, without acute or chronic infarction. Vascular: No abnormal hyperdensity of the major intracranial arteries or dural venous sinuses. No intracranial atherosclerosis. Skull: The visualized skull base, calvarium and extracranial soft tissues are normal. Sinuses/Orbits: No fluid levels or advanced mucosal thickening of the visualized paranasal sinuses. No mastoid or middle ear effusion. The orbits are normal. IMPRESSION: Normal head CT. Electronically Signed   By: Deatra Robinson M.D.   On: 06/09/2020 20:59   CT Angio Neck W and/or Wo Contrast  Result Date: 06/09/2020 CLINICAL DATA:  Left-sided facial numbness and tingling EXAM: CT ANGIOGRAPHY HEAD AND NECK TECHNIQUE: Multidetector CT imaging of the head and neck was performed using the standard protocol during bolus administration of intravenous contrast. Multiplanar CT image reconstructions and MIPs were obtained to evaluate the vascular anatomy. Carotid stenosis measurements (when applicable) are obtained utilizing NASCET criteria, using the distal internal carotid diameter as the denominator. CONTRAST:  OMNIPAQUE IOHEXOL 350 MG/ML SOLN COMPARISON:  None. FINDINGS: CTA NECK FINDINGS SKELETON: There is no bony spinal canal stenosis.  No lytic or blastic lesion. OTHER NECK: Normal pharynx, larynx and major salivary glands. No cervical lymphadenopathy. Unremarkable thyroid gland. UPPER CHEST: Multiple peripheral predominant ground-glass opacities in the lung apices. AORTIC ARCH: There is no calcific atherosclerosis of the aortic arch. There is no aneurysm, dissection or hemodynamically significant stenosis of the visualized portion of the aorta. Conventional 3 vessel aortic branching pattern. The visualized proximal subclavian arteries are widely patent. RIGHT CAROTID SYSTEM: Normal without aneurysm,  dissection or stenosis. LEFT CAROTID SYSTEM: Normal without aneurysm, dissection or stenosis. VERTEBRAL ARTERIES: Assessment limited by body habitus but appear to be patent to the skull base. CTA HEAD FINDINGS POSTERIOR CIRCULATION: --Vertebral arteries: Normal V4 segments. --Inferior cerebellar arteries: Normal. --Basilar artery: Normal. --Superior cerebellar arteries: Normal. --Posterior cerebral arteries (PCA): Normal. ANTERIOR CIRCULATION: --Intracranial internal carotid arteries: Normal. --Anterior cerebral arteries (ACA): Normal. Both A1 segments are present. Patent anterior communicating artery (a-comm). --Middle cerebral arteries (MCA): Normal. VENOUS SINUSES: As permitted by contrast timing, patent. ANATOMIC VARIANTS: None Review of the MIP images confirms the above findings. IMPRESSION: 1. No emergent large vessel occlusion or high-grade stenosis of the intracranial arteries. 2. Multiple peripheral predominant ground-glass opacities in the lung apices, consistent with viral pneumonia. Electronically Signed   By: Deatra RobinsonKevin  Herman M.D.   On: 06/09/2020 22:53   DG Chest Portable 1 View  Result Date: 06/09/2020 CLINICAL DATA:  Shortness of breath. EXAM: PORTABLE CHEST 1 VIEW COMPARISON:  None. FINDINGS: Low lung volumes. Upper normal heart size likely accentuated by portable technique and low lung volumes. Peribronchial thickening versus bronchovascular crowding. No confluent consolidation. No pneumothorax or pleural effusion. No acute osseous abnormalities are seen. Soft tissue attenuation from habitus partially limits assessment. IMPRESSION: 1. Low lung volumes. Upper normal heart size likely accentuated by portable technique and low lung volumes. 2. Peribronchial thickening versus bronchovascular crowding. Electronically Signed   By: Narda RutherfordMelanie  Sanford M.D.   On: 06/09/2020 20:24

## 2020-06-12 NOTE — Progress Notes (Addendum)
At midnight: Patient BP 157/115, recheck 166/99, HR in the 90's to low 100s. BP check on left forearm and left calf. Patient asymp.denies pain. B.Chotiner paged.  Patient given IV metoprolol one time dose. Recheck in BP 160/101, recheck 171/99. HR in the 80s. Patient remains asymp. Provider made aware. Losartan ordered.   Morning BP 146/98, HR in the 80s.

## 2020-06-12 NOTE — Plan of Care (Signed)
Patient is currently resting in bed. Denies pain. VSS. Remains on 2L [place]. OOB ambulating in room. Voiding. Call bell within reach.   Problem: Education: Goal: Knowledge of risk factors and measures for prevention of condition will improve Outcome: Progressing   Problem: Coping: Goal: Psychosocial and spiritual needs will be supported Outcome: Progressing   Problem: Respiratory: Goal: Will maintain a patent airway Outcome: Progressing Goal: Complications related to the disease process, condition or treatment will be avoided or minimized Outcome: Progressing   Problem: Education: Goal: Knowledge of General Education information will improve Description: Including pain rating scale, medication(s)/side effects and non-pharmacologic comfort measures Outcome: Progressing   Problem: Health Behavior/Discharge Planning: Goal: Ability to manage health-related needs will improve Outcome: Progressing   Problem: Clinical Measurements: Goal: Ability to maintain clinical measurements within normal limits will improve Outcome: Progressing Goal: Will remain free from infection Outcome: Progressing Goal: Diagnostic test results will improve Outcome: Progressing Goal: Respiratory complications will improve Outcome: Progressing Goal: Cardiovascular complication will be avoided Outcome: Progressing   Problem: Activity: Goal: Risk for activity intolerance will decrease Outcome: Progressing   Problem: Nutrition: Goal: Adequate nutrition will be maintained Outcome: Progressing   Problem: Coping: Goal: Level of anxiety will decrease Outcome: Progressing   Problem: Elimination: Goal: Will not experience complications related to bowel motility Outcome: Progressing Goal: Will not experience complications related to urinary retention Outcome: Progressing   Problem: Pain Managment: Goal: General experience of comfort will improve Outcome: Progressing   Problem: Safety: Goal: Ability to  remain free from injury will improve Outcome: Progressing   Problem: Skin Integrity: Goal: Risk for impaired skin integrity will decrease Outcome: Progressing

## 2020-06-13 DIAGNOSIS — J9601 Acute respiratory failure with hypoxia: Secondary | ICD-10-CM | POA: Diagnosis not present

## 2020-06-13 DIAGNOSIS — U071 COVID-19: Secondary | ICD-10-CM | POA: Diagnosis not present

## 2020-06-13 LAB — COMPREHENSIVE METABOLIC PANEL
ALT: 107 U/L — ABNORMAL HIGH (ref 0–44)
AST: 92 U/L — ABNORMAL HIGH (ref 15–41)
Albumin: 3.3 g/dL — ABNORMAL LOW (ref 3.5–5.0)
Alkaline Phosphatase: 123 U/L (ref 38–126)
Anion gap: 11 (ref 5–15)
BUN: 14 mg/dL (ref 6–20)
CO2: 26 mmol/L (ref 22–32)
Calcium: 9.2 mg/dL (ref 8.9–10.3)
Chloride: 100 mmol/L (ref 98–111)
Creatinine, Ser: 1 mg/dL (ref 0.61–1.24)
GFR calc Af Amer: >60 mL/min (ref >60)
GFR calc non Af Amer: >60 mL/min (ref >60)
Glucose, Bld: 142 mg/dL — ABNORMAL HIGH (ref 70–99)
Potassium: 4.1 mmol/L (ref 3.5–5.1)
Sodium: 137 mmol/L (ref 135–145)
Total Bilirubin: 0.5 mg/dL (ref 0.3–1.2)
Total Protein: 7.6 g/dL (ref 6.5–8.1)

## 2020-06-13 LAB — GLUCOSE, CAPILLARY
Glucose-Capillary: 142 mg/dL — ABNORMAL HIGH (ref 70–99)
Glucose-Capillary: 127 mg/dL — ABNORMAL HIGH (ref 70–99)
Glucose-Capillary: 143 mg/dL — ABNORMAL HIGH (ref 70–99)
Glucose-Capillary: 144 mg/dL — ABNORMAL HIGH (ref 70–99)

## 2020-06-13 LAB — MAGNESIUM: Magnesium: 2.4 mg/dL (ref 1.7–2.4)

## 2020-06-13 LAB — CBC WITH DIFFERENTIAL/PLATELET
Abs Immature Granulocytes: 0.13 10*3/uL — ABNORMAL HIGH (ref 0.00–0.07)
Basophils Absolute: 0 10*3/uL (ref 0.0–0.1)
Basophils Relative: 0 %
Eosinophils Absolute: 0 10*3/uL (ref 0.0–0.5)
Eosinophils Relative: 0 %
HCT: 43.9 % (ref 39.0–52.0)
Hemoglobin: 14.1 g/dL (ref 13.0–17.0)
Immature Granulocytes: 1 %
Lymphocytes Relative: 8 %
Lymphs Abs: 1.2 10*3/uL (ref 0.7–4.0)
MCH: 28.9 pg (ref 26.0–34.0)
MCHC: 32.1 g/dL (ref 30.0–36.0)
MCV: 90 fL (ref 80.0–100.0)
Monocytes Absolute: 0.8 10*3/uL (ref 0.1–1.0)
Monocytes Relative: 5 %
Neutro Abs: 13.6 10*3/uL — ABNORMAL HIGH (ref 1.7–7.7)
Neutrophils Relative %: 86 %
Platelets: 317 10*3/uL (ref 150–400)
RBC: 4.88 MIL/uL (ref 4.22–5.81)
RDW: 13.5 % (ref 11.5–15.5)
WBC: 15.7 10*3/uL — ABNORMAL HIGH (ref 4.0–10.5)
nRBC: 0 % (ref 0.0–0.2)

## 2020-06-13 LAB — D-DIMER, QUANTITATIVE: D-Dimer, Quant: 0.39 ug/mL-FEU (ref 0.00–0.50)

## 2020-06-13 LAB — C-REACTIVE PROTEIN: CRP: 8.7 mg/dL — ABNORMAL HIGH (ref <1.0)

## 2020-06-13 LAB — BRAIN NATRIURETIC PEPTIDE: B Natriuretic Peptide: 89.3 pg/mL (ref 0.0–100.0)

## 2020-06-13 MED ORDER — CARVEDILOL 12.5 MG PO TABS
12.5000 mg | ORAL_TABLET | Freq: Two times a day (BID) | ORAL | Status: DC
  Administered 2020-06-13 – 2020-06-14 (×2): 12.5 mg via ORAL
  Filled 2020-06-13 (×2): qty 1

## 2020-06-13 MED ORDER — HYDRALAZINE HCL 50 MG PO TABS
50.0000 mg | ORAL_TABLET | Freq: Three times a day (TID) | ORAL | Status: DC
  Administered 2020-06-13 – 2020-06-14 (×4): 50 mg via ORAL
  Filled 2020-06-13 (×3): qty 1

## 2020-06-13 MED ORDER — METHYLPREDNISOLONE SODIUM SUCC 40 MG IJ SOLR
40.0000 mg | Freq: Every day | INTRAMUSCULAR | Status: DC
  Administered 2020-06-13 – 2020-06-14 (×2): 40 mg via INTRAVENOUS
  Filled 2020-06-13: qty 1

## 2020-06-13 NOTE — Progress Notes (Signed)
Patient arrived to unit a/o/v. V/S taken. No acute distress noted. Will continue to monitor.

## 2020-06-13 NOTE — TOC Initial Note (Signed)
Transition of Care [facility]) - Initial/Assessment Note    Patient Details  Name: Frank Knight MRN: [id number] Date of Birth: 07/02/1996  [facility] CM/SW Contact:    Lockie Pares, RN Phone Number: 06/13/2020, 4:07 PM  Clinical Narrative:                 Admitted for COVID Pneumonia on 2L oxygen currently. Receiving IV medications for treatment. . Patient uninsured, may need medication assistance, oxygen assistance CM will follow for needs.   Expected Discharge Plan: Home/Self Care Barriers to Discharge: Continued Medical Work up   Patient Goals and CMS Choice        Expected Discharge Plan and Services Expected Discharge Plan: Home/Self Care   Discharge Planning Services: CM Consult   Living arrangements for the past 2 months: Apartment                                      Prior Living Arrangements/Services Living arrangements for the past 2 months: Apartment Lives with:: Spouse Patient language and need for interpreter reviewed:: Yes        Need for Family Participation in Patient Care: Yes (Comment) Care giver support system in place?: Yes (comment)   Criminal Activity/Legal Involvement Pertinent to Current Situation/Hospitalization: No - Comment as needed  Activities of Daily Living Home Assistive Devices/Equipment: None ADL Screening (condition at time of admission) Patient's cognitive ability adequate to safely complete daily activities?: Yes Is the patient deaf or have difficulty hearing?: No Does the patient have difficulty seeing, even when wearing glasses/contacts?: No Does the patient have difficulty concentrating, remembering, or making decisions?: No Patient able to express need for assistance with ADLs?: Yes Does the patient have difficulty dressing or bathing?: No Independently performs ADLs?: Yes (appropriate for developmental age) Does the patient have difficulty walking or climbing stairs?: No Weakness of Legs:  None Weakness of Arms/Hands: None  Permission Sought/Granted                  Emotional Assessment       Orientation: : Oriented to Self, Oriented to Place, Oriented to  Time, Oriented to Situation Alcohol / Substance Use: Not Applicable Psych Involvement: No (comment)  Admission diagnosis:  Left-sided weakness [R53.1] Acute hypoxemic respiratory failure due to COVID-19 (HCC) [U07.1, J96.01] COVID-19 [U07.1] Patient Active Problem List   Diagnosis Date Noted  . Acute hypoxemic respiratory failure due to COVID-19 (HCC) 06/09/2020   PCP:  Patient, No Pcp Per Pharmacy:   [id number] - [place], [place] - [address] AT [facility] [address] [place] [id number] Phone: [phone] Fax: [phone]     Social Determinants of Health (SDOH) Interventions    Readmission Risk Interventions No flowsheet data found.

## 2020-06-13 NOTE — Progress Notes (Signed)
PROGRESS NOTE                                                                                                                                                                                                             Patient Demographics:    Frank Knight, is a 24 y.o. male, DOB - 11-08-1995, WGN:[id number]  Outpatient Primary MD for the patient is Patient, No Pcp Per    LOS - 3  Admit date - 06/09/2020    Chief Complaint  Patient presents with  . Shortness of Breath    fever       Brief Narrative -  Frank Knight is a 24 y.o. male with medical history significant of morbid obesity, presented with new onset symptoms of fingers and toes numbness, low-grade fevers and shortness of breath.  In the ER he was diagnosed with acute hypoxic respiratory failure due to COVID-19 pneumonia, CT head, CTA head and neck were unremarkable.  Paresthesias resolved.  He was admitted for treatment of COVID-19 pneumonia.   Subjective:   Patient in bed, appears comfortable, denies any headache, no fever, no chest pain or pressure, improved shortness of breath , no abdominal pain. No focal weakness.    Assessment  & Plan :     1. Acute Hypoxic Resp. Failure due to Acute Covid 19 Viral Pneumonitis during the ongoing 2020 Covid 19 Pandemic  - he is unfortunately not vaccinated, has moderate parenchymal lung injury, has been placed on steroids and Remdesivir which will be continued, monitor closely.  Encouraged the patient to sit up in chair in the daytime use I-S and flutter valve for pulmonary toiletry and then prone in bed when at night.  Will advance activity and titrate down oxygen as possible.   SpO2: 93 % O2 Flow Rate (L/min): 2 L/min  Recent Labs  Lab 06/09/20 1930 06/09/20 2014 06/09/20 2329 06/09/20 2344 06/11/20 0925 06/12/20 1147 06/13/20 0742  WBC  --  8.3  --   --  8.3 16.1* 15.7*  PLT  --  238  --   --  250 295 317  CRP   --   --  11.8*  --  8.5* 5.7* 8.7*  BNP  --  17.6  --   --   --  105.8* 89.3  DDIMER  --   --  0.63*  --  0.54* 0.29 0.39  PROCALCITON  --   --  0.20  --   --   --   --   AST  --  47*  --   --  54* 109* 92*  ALT  --  45*  --   --  44 107* 107*  ALKPHOS  --  121  --   --  110 123 123  BILITOT  --  0.5  --   --  0.8 0.7 0.5  ALBUMIN  --  3.9  --   --  3.4* 3.5 3.3*  INR  --  1.0  --   --   --   --   --   LATICACIDVEN  --  1.3  --  0.8  --   --   --   SARSCOV2NAA POSITIVE*  --   --   --   --   --   --     2.  Morbid obesity.  BMI greater than 40.  Follow with PCP for weight loss.  Likely has underlying OSA and OHS as well outpatient sleep study recommended.  3.  Hypertension.  Poorly controlled currently on combination of Norvasc, ACE inhibitor, Coreg, added hydralazine for better control.  Will need to be monitored outpatient.   Condition - Guarded  Family Communication  :  Wife [phone] on 06/11/20  Code Status :  Full  Consults  :  None  Procedures  :     CT Head, CTA Head and Neck - non acute  PUD Prophylaxis : None  Disposition Plan  :    Status is: Inpatient  Remains inpatient appropriate because:IV treatments appropriate due to intensity of illness or inability to take PO   Dispo: The patient is from: Home              Anticipated d/c is to: Home              Anticipated d/c date is: > 3 days              Patient currently is not medically stable to d/c.   DVT Prophylaxis  :  Lovenox   Lab Results  Component Value Date   PLT 317 06/13/2020    Diet :  Diet Order            Diet regular Room service appropriate? Yes; Fluid consistency: Thin  Diet effective now                  Inpatient Medications  Scheduled Meds: . amLODipine  10 mg Oral Daily  . carvedilol  12.5 mg Oral BID WC  . enoxaparin (LOVENOX) injection  90 mg Subcutaneous Q24H  . hydrALAZINE  50 mg Oral Q8H  . insulin aspart  0-15 Units Subcutaneous TID WC  . insulin aspart  0-5  Units Subcutaneous QHS  . Ipratropium-Albuterol  1 puff Inhalation Q6H  . losartan  50 mg Oral Daily  . methylPREDNISolone (SOLU-MEDROL) injection  40 mg Intravenous Daily   Continuous Infusions: . remdesivir 100 mg in NS 100 mL 100 mg (06/13/20 0822)   PRN Meds:.acetaminophen, guaiFENesin-dextromethorphan  Antibiotics  :    Anti-infectives (From admission, onward)   Start     Dose/Rate Route Frequency Ordered Stop   06/11/20 1000  remdesivir 100 mg in sodium chloride 0.9 % 100 mL IVPB        100 mg 200 mL/hr over 30 Minutes Intravenous Daily 06/10/20 0022 06/15/20 0959  06/10/20 1000  remdesivir 100 mg in sodium chloride 0.9 % 100 mL IVPB  Status:  Discontinued       "Followed by" Linked Group Details   100 mg 200 mL/hr over 30 Minutes Intravenous Daily 06/09/20 2333 06/10/20 0021   06/10/20 0030  remdesivir 100 mg in sodium chloride 0.9 % 100 mL IVPB        100 mg 200 mL/hr over 30 Minutes Intravenous  Once 06/10/20 0022 06/10/20 0209   06/09/20 2345  remdesivir 200 mg in sodium chloride 0.9% 250 mL IVPB  Status:  Discontinued       "Followed by" Linked Group Details   200 mg 580 mL/hr over 30 Minutes Intravenous Once 06/09/20 2333 06/10/20 0021       Time Spent in minutes  30   Susa Raring M.D on 06/13/2020 at 10:59 AM  To page go to [email or web address] - password [facility]  [facility] -  Office  [phone]     See all Orders from today for further details    Objective:   Vitals:   06/12/20 1947 06/13/20 0512 06/13/20 0811 06/13/20 0814  BP: (!) 149/100 (!) 148/119  (!) 158/118  Pulse: 99 98    Resp: 20 18    Temp: 98.9 F (37.2 C) 98.2 F (36.8 C)    TempSrc: Oral Oral    SpO2: 90% 91% 93%   Weight:      Height:        Wt Readings from Last 3 Encounters:  06/11/20 (!) 183.3 kg  03/29/17 (!) 142.9 kg     Intake/Output Summary (Last 24 hours) at 06/13/2020 1059 Last data filed at 06/12/2020 1500 Gross per 24 hour  Intake 240 ml  Output 200 ml    Net 40 ml     Physical Exam  Awake Alert, No new F.N deficits, Normal affect [place].AT,PERRAL Supple Neck,No JVD, No cervical lymphadenopathy appriciated.  Symmetrical Chest wall movement, Good air movement bilaterally, CTAB RRR,No Gallops, Rubs or new Murmurs, No Parasternal Heave +ve B.Sounds, Abd Soft, No tenderness, No organomegaly appriciated, No rebound - guarding or rigidity. No Cyanosis, Clubbing or edema, No new Rash or bruise     Data Review:    CBC Recent Labs  Lab 06/09/20 2014 06/11/20 0925 06/12/20 1147 06/13/20 0742  WBC 8.3 8.3 16.1* 15.7*  HGB 14.5 14.0 14.4 14.1  HCT 44.4 43.6 46.1 43.9  PLT 238 250 295 317  MCV 87.7 88.1 89.9 90.0  MCH 28.7 28.3 28.1 28.9  MCHC 32.7 32.1 31.2 32.1  RDW 13.2 13.4 13.4 13.5  LYMPHSABS 1.3 0.8 0.9 1.2  MONOABS 0.4 0.2 0.6 0.8  EOSABS 0.0 0.0 0.0 0.0  BASOSABS 0.0 0.0 0.0 0.0    Recent Labs  Lab 06/09/20 2014 06/09/20 2329 06/09/20 2344 06/11/20 0925 06/12/20 1147 06/13/20 0742  NA 137  --   --  137 136 137  K 3.9  --   --  4.0 4.2 4.1  CL 100  --   --  103 100 100  CO2 26  --   --  23 24 26   GLUCOSE 97  --   --  169* 134* 142*  BUN 9  --   --  12 12 14   CREATININE 1.33*  --  1.19 1.01 1.05 1.00  CALCIUM 8.8*  --   --  9.3 9.4 9.2  AST 47*  --   --  54* 109* 92*  ALT 45*  --   --  44 107* 107*  ALKPHOS 121  --   --  110 123 123  BILITOT 0.5  --   --  0.8 0.7 0.5  ALBUMIN 3.9  --   --  3.4* 3.5 3.3*  MG  --   --   --  2.2 2.3 2.4  CRP  --  11.8*  --  8.5* 5.7* 8.7*  DDIMER  --  0.63*  --  0.54* 0.29 0.39  PROCALCITON  --  0.20  --   --   --   --   LATICACIDVEN 1.3  --  0.8  --   --   --   INR 1.0  --   --   --   --   --   HGBA1C  --   --   --  5.8*  --   --   BNP 17.6  --   --   --  105.8* 89.3    ------------------------------------------------------------------------------------------------------------------ No results for input(s): CHOL, HDL, LDLCALC, TRIG, CHOLHDL, LDLDIRECT in the last 72  hours.  Lab Results  Component Value Date   HGBA1C 5.8 (H) 06/11/2020   ------------------------------------------------------------------------------------------------------------------ No results for input(s): TSH, T4TOTAL, T3FREE, THYROIDAB in the last 72 hours.  Invalid input(s): FREET3  Cardiac Enzymes No results for input(s): CKMB, TROPONINI, MYOGLOBIN in the last 168 hours.  Invalid input(s): CK ------------------------------------------------------------------------------------------------------------------    Component Value Date/Time   BNP 89.3 06/13/2020 0742    Micro Results Recent Results (from the past 240 hour(s))  Respiratory Panel by RT PCR (Flu A&B, Covid) - Nasopharyngeal Swab     Status: Abnormal   Collection Time: 06/09/20  7:30 PM   Specimen: Nasopharyngeal Swab  Result Value Ref Range Status   SARS Coronavirus 2 by RT PCR POSITIVE (A) NEGATIVE Final    Comment: RESULT CALLED TO, READ BACK BY AND VERIFIED WITH: KELLY NEAL RN @2045  06/09/2020 OLSONM (NOTE) SARS-CoV-2 target nucleic acids are DETECTED.  SARS-CoV-2 RNA is generally detectable in upper respiratory specimens  during the acute phase of infection. Positive results are indicative of the presence of the identified virus, but do not rule out bacterial infection or co-infection with other pathogens not detected by the test. Clinical correlation with patient history and other diagnostic information is necessary to determine patient infection status. The expected result is Negative.  Fact Sheet for Patients:  https://www.moore.com/https://www.fda.gov/media/142436/download  Fact Sheet for Healthcare Providers: https://www.young.biz/https://www.fda.gov/media/142435/download  This test is not yet approved or cleared by the [place] FDA and  has been authorized for detection and/or diagnosis of SARS-CoV-2 by FDA under an Emergency Use Authorization (EUA).  This EUA will remain in effect (meaning this test can  be used) for the  duration of  the COVID-19 declaration under Section 564(b)(1) of the Act, 21 U.S.C. section 360bbb-3(b)(1), unless the authorization is terminated or revoked sooner.      Influenza A by PCR NEGATIVE NEGATIVE Final   Influenza B by PCR NEGATIVE NEGATIVE Final    Comment: (NOTE) The Xpert Xpress SARS-CoV-2/FLU/RSV assay is intended as an aid in  the diagnosis of influenza from Nasopharyngeal swab specimens and  should not be used as a sole basis for treatment. Nasal washings and  aspirates are unacceptable for Xpert Xpress SARS-CoV-2/FLU/RSV  testing.  Fact Sheet for Patients: https://www.moore.com/https://www.fda.gov/media/142436/download  Fact Sheet for Healthcare Providers: https://www.young.biz/https://www.fda.gov/media/142435/download  This test is not yet approved or cleared by the [place] FDA and  has been authorized for detection and/or diagnosis of SARS-CoV-2 by  FDA under  an Emergency Use Authorization (EUA). This EUA will remain  in effect (meaning this test can be used) for the duration of the  Covid-19 declaration under Section 564(b)(1) of the Act, 21  U.S.C. section 360bbb-3(b)(1), unless the authorization is  terminated or revoked. Performed at [facility], [address] [place], [place] [address]   Culture, blood (routine x 2)     Status: None (Preliminary result)   Collection Time: 06/09/20  8:14 PM   Specimen: BLOOD RIGHT ARM  Result Value Ref Range Status   Specimen Description   Final    BLOOD RIGHT ARM Performed at [facility], [address] [place], [place] [address]    Special Requests   Final    BOTTLES DRAWN AEROBIC AND ANAEROBIC Blood Culture adequate volume Performed at [facility], [address] [place], [place] [address]    Culture   Final    NO GROWTH 4 DAYS Performed at [facility], [address] [place], [place] [address]    Report Status PENDING  Incomplete    Radiology Reports CT Angio Head W/Cm &/Or Wo Cm  Result  Date: 06/09/2020 CLINICAL DATA:  Left-sided facial numbness and tingling EXAM: CT ANGIOGRAPHY HEAD AND NECK TECHNIQUE: Multidetector CT imaging of the head and neck was performed using the standard protocol during bolus administration of intravenous contrast. Multiplanar CT image reconstructions and MIPs were obtained to evaluate the vascular anatomy. Carotid stenosis measurements (when applicable) are obtained utilizing NASCET criteria, using the distal internal carotid diameter as the denominator. CONTRAST:  OMNIPAQUE IOHEXOL 350 MG/ML SOLN COMPARISON:  None. FINDINGS: CTA NECK FINDINGS SKELETON: There is no bony spinal canal stenosis. No lytic or blastic lesion. OTHER NECK: Normal pharynx, larynx and major salivary glands. No cervical lymphadenopathy. Unremarkable thyroid gland. UPPER CHEST: Multiple peripheral predominant ground-glass opacities in the lung apices. AORTIC ARCH: There is no calcific atherosclerosis of the aortic arch. There is no aneurysm, dissection or hemodynamically significant stenosis of the visualized portion of the aorta. Conventional 3 vessel aortic branching pattern. The visualized proximal subclavian arteries are widely patent. RIGHT CAROTID SYSTEM: Normal without aneurysm, dissection or stenosis. LEFT CAROTID SYSTEM: Normal without aneurysm, dissection or stenosis. VERTEBRAL ARTERIES: Assessment limited by body habitus but appear to be patent to the skull base. CTA HEAD FINDINGS POSTERIOR CIRCULATION: --Vertebral arteries: Normal V4 segments. --Inferior cerebellar arteries: Normal. --Basilar artery: Normal. --Superior cerebellar arteries: Normal. --Posterior cerebral arteries (PCA): Normal. ANTERIOR CIRCULATION: --Intracranial internal carotid arteries: Normal. --Anterior cerebral arteries (ACA): Normal. Both A1 segments are present. Patent anterior communicating artery (a-comm). --Middle cerebral arteries (MCA): Normal. VENOUS SINUSES: As permitted by contrast timing, patent.  ANATOMIC VARIANTS: None Review of the MIP images confirms the above findings. IMPRESSION: 1. No emergent large vessel occlusion or high-grade stenosis of the intracranial arteries. 2. Multiple peripheral predominant ground-glass opacities in the lung apices, consistent with viral pneumonia. Electronically Signed   By: Deatra Robinson M.D.   On: 06/09/2020 22:53   CT Head Wo Contrast  Result Date: 06/09/2020 CLINICAL DATA:  Left-sided numbness and tingling. EXAM: CT HEAD WITHOUT CONTRAST TECHNIQUE: Contiguous axial images were obtained from the base of the skull through the vertex without intravenous contrast. COMPARISON:  None. FINDINGS: Brain: There is no mass, hemorrhage or extra-axial collection. The size and configuration of the ventricles and extra-axial CSF spaces are normal. The brain parenchyma is normal, without acute or chronic infarction. Vascular: No abnormal hyperdensity of  the major intracranial arteries or dural venous sinuses. No intracranial atherosclerosis. Skull: The visualized skull base, calvarium and extracranial soft tissues are normal. Sinuses/Orbits: No fluid levels or advanced mucosal thickening of the visualized paranasal sinuses. No mastoid or middle ear effusion. The orbits are normal. IMPRESSION: Normal head CT. Electronically Signed   By: Deatra Robinson M.D.   On: 06/09/2020 20:59   CT Angio Neck W and/or Wo Contrast  Result Date: 06/09/2020 CLINICAL DATA:  Left-sided facial numbness and tingling EXAM: CT ANGIOGRAPHY HEAD AND NECK TECHNIQUE: Multidetector CT imaging of the head and neck was performed using the standard protocol during bolus administration of intravenous contrast. Multiplanar CT image reconstructions and MIPs were obtained to evaluate the vascular anatomy. Carotid stenosis measurements (when applicable) are obtained utilizing NASCET criteria, using the distal internal carotid diameter as the denominator. CONTRAST:  OMNIPAQUE IOHEXOL 350 MG/ML SOLN COMPARISON:   None. FINDINGS: CTA NECK FINDINGS SKELETON: There is no bony spinal canal stenosis. No lytic or blastic lesion. OTHER NECK: Normal pharynx, larynx and major salivary glands. No cervical lymphadenopathy. Unremarkable thyroid gland. UPPER CHEST: Multiple peripheral predominant ground-glass opacities in the lung apices. AORTIC ARCH: There is no calcific atherosclerosis of the aortic arch. There is no aneurysm, dissection or hemodynamically significant stenosis of the visualized portion of the aorta. Conventional 3 vessel aortic branching pattern. The visualized proximal subclavian arteries are widely patent. RIGHT CAROTID SYSTEM: Normal without aneurysm, dissection or stenosis. LEFT CAROTID SYSTEM: Normal without aneurysm, dissection or stenosis. VERTEBRAL ARTERIES: Assessment limited by body habitus but appear to be patent to the skull base. CTA HEAD FINDINGS POSTERIOR CIRCULATION: --Vertebral arteries: Normal V4 segments. --Inferior cerebellar arteries: Normal. --Basilar artery: Normal. --Superior cerebellar arteries: Normal. --Posterior cerebral arteries (PCA): Normal. ANTERIOR CIRCULATION: --Intracranial internal carotid arteries: Normal. --Anterior cerebral arteries (ACA): Normal. Both A1 segments are present. Patent anterior communicating artery (a-comm). --Middle cerebral arteries (MCA): Normal. VENOUS SINUSES: As permitted by contrast timing, patent. ANATOMIC VARIANTS: None Review of the MIP images confirms the above findings. IMPRESSION: 1. No emergent large vessel occlusion or high-grade stenosis of the intracranial arteries. 2. Multiple peripheral predominant ground-glass opacities in the lung apices, consistent with viral pneumonia. Electronically Signed   By: Deatra Robinson M.D.   On: 06/09/2020 22:53   DG Chest Portable 1 View  Result Date: 06/09/2020 CLINICAL DATA:  Shortness of breath. EXAM: PORTABLE CHEST 1 VIEW COMPARISON:  None. FINDINGS: Low lung volumes. Upper normal heart size likely  accentuated by portable technique and low lung volumes. Peribronchial thickening versus bronchovascular crowding. No confluent consolidation. No pneumothorax or pleural effusion. No acute osseous abnormalities are seen. Soft tissue attenuation from habitus partially limits assessment. IMPRESSION: 1. Low lung volumes. Upper normal heart size likely accentuated by portable technique and low lung volumes. 2. Peribronchial thickening versus bronchovascular crowding. Electronically Signed   By: Narda Rutherford M.D.   On: 06/09/2020 20:24

## 2020-06-14 ENCOUNTER — Inpatient Hospital Stay (HOSPITAL_COMMUNITY): Payer: Self-pay

## 2020-06-14 DIAGNOSIS — U071 COVID-19: Secondary | ICD-10-CM | POA: Diagnosis not present

## 2020-06-14 LAB — COMPREHENSIVE METABOLIC PANEL
ALT: 120 U/L — ABNORMAL HIGH (ref 0–44)
AST: 96 U/L — ABNORMAL HIGH (ref 15–41)
Albumin: 3.1 g/dL — ABNORMAL LOW (ref 3.5–5.0)
Alkaline Phosphatase: 127 U/L — ABNORMAL HIGH (ref 38–126)
Anion gap: 11 (ref 5–15)
BUN: 16 mg/dL (ref 6–20)
CO2: 26 mmol/L (ref 22–32)
Calcium: 8.9 mg/dL (ref 8.9–10.3)
Chloride: 96 mmol/L — ABNORMAL LOW (ref 98–111)
Creatinine, Ser: 1.21 mg/dL (ref 0.61–1.24)
GFR calc Af Amer: >60 mL/min (ref >60)
GFR calc non Af Amer: >60 mL/min (ref >60)
Glucose, Bld: 114 mg/dL — ABNORMAL HIGH (ref 70–99)
Potassium: 4.1 mmol/L (ref 3.5–5.1)
Sodium: 133 mmol/L — ABNORMAL LOW (ref 135–145)
Total Bilirubin: 0.8 mg/dL (ref 0.3–1.2)
Total Protein: 7.3 g/dL (ref 6.5–8.1)

## 2020-06-14 LAB — CULTURE, BLOOD (ROUTINE X 2)
Culture: NO GROWTH
Special Requests: ADEQUATE

## 2020-06-14 LAB — CBC WITH DIFFERENTIAL/PLATELET
Abs Immature Granulocytes: 0.14 10*3/uL — ABNORMAL HIGH (ref 0.00–0.07)
Basophils Absolute: 0 10*3/uL (ref 0.0–0.1)
Basophils Relative: 0 %
Eosinophils Absolute: 0 10*3/uL (ref 0.0–0.5)
Eosinophils Relative: 0 %
HCT: 44.4 % (ref 39.0–52.0)
Hemoglobin: 13.9 g/dL (ref 13.0–17.0)
Immature Granulocytes: 1 %
Lymphocytes Relative: 13 %
Lymphs Abs: 2.1 10*3/uL (ref 0.7–4.0)
MCH: 28.1 pg (ref 26.0–34.0)
MCHC: 31.3 g/dL (ref 30.0–36.0)
MCV: 89.7 fL (ref 80.0–100.0)
Monocytes Absolute: 1.1 10*3/uL — ABNORMAL HIGH (ref 0.1–1.0)
Monocytes Relative: 7 %
Neutro Abs: 12.9 10*3/uL — ABNORMAL HIGH (ref 1.7–7.7)
Neutrophils Relative %: 79 %
Platelets: 297 10*3/uL (ref 150–400)
RBC: 4.95 MIL/uL (ref 4.22–5.81)
RDW: 13.4 % (ref 11.5–15.5)
WBC: 16.2 10*3/uL — ABNORMAL HIGH (ref 4.0–10.5)
nRBC: 0 % (ref 0.0–0.2)

## 2020-06-14 LAB — GLUCOSE, CAPILLARY
Glucose-Capillary: 103 mg/dL — ABNORMAL HIGH (ref 70–99)
Glucose-Capillary: 118 mg/dL — ABNORMAL HIGH (ref 70–99)

## 2020-06-14 LAB — C-REACTIVE PROTEIN: CRP: 7 mg/dL — ABNORMAL HIGH (ref <1.0)

## 2020-06-14 LAB — D-DIMER, QUANTITATIVE: D-Dimer, Quant: 0.5 ug/mL-FEU (ref 0.00–0.50)

## 2020-06-14 LAB — BRAIN NATRIURETIC PEPTIDE: B Natriuretic Peptide: 92.2 pg/mL (ref 0.0–100.0)

## 2020-06-14 LAB — PROCALCITONIN: Procalcitonin: 0.17 ng/mL

## 2020-06-14 LAB — MAGNESIUM: Magnesium: 2.3 mg/dL (ref 1.7–2.4)

## 2020-06-14 MED ORDER — LEVOFLOXACIN 750 MG PO TABS: 750.0000 mg | ORAL_TABLET | Freq: Every day | ORAL | 0 refills | Status: AC

## 2020-06-14 MED ORDER — METHYLPREDNISOLONE 4 MG PO TBPK: ORAL_TABLET | ORAL | 0 refills | Status: AC

## 2020-06-14 MED ORDER — LEVOFLOXACIN 500 MG PO TABS
750.0000 mg | ORAL_TABLET | Freq: Every day | ORAL | Status: DC
  Administered 2020-06-14: 750 mg via ORAL
  Filled 2020-06-14: qty 2

## 2020-06-14 MED ORDER — CARVEDILOL 12.5 MG PO TABS: 12.5000 mg | ORAL_TABLET | Freq: Two times a day (BID) | ORAL | 0 refills | Status: AC

## 2020-06-14 MED ORDER — HYDRALAZINE HCL 50 MG PO TABS: 100.0000 mg | ORAL_TABLET | Freq: Three times a day (TID) | ORAL | Status: DC

## 2020-06-14 MED ORDER — AMLODIPINE BESYLATE 10 MG PO TABS: 10.0000 mg | ORAL_TABLET | Freq: Every day | ORAL | 0 refills | Status: AC

## 2020-06-14 MED ORDER — LOSARTAN POTASSIUM 50 MG PO TABS: 50.0000 mg | ORAL_TABLET | Freq: Every day | ORAL | 0 refills | Status: AC

## 2020-06-14 MED ORDER — ALBUTEROL SULFATE HFA 108 (90 BASE) MCG/ACT IN AERS
2.0000 | INHALATION_SPRAY | Freq: Four times a day (QID) | RESPIRATORY_TRACT | 0 refills | Status: AC | PRN
Start: 2020-06-14 — End: ?

## 2020-06-14 NOTE — Progress Notes (Signed)
D/C instruction given to pt. IV d/c'd. Patient d/c'd home with O2 tank.

## 2020-06-14 NOTE — Discharge Summary (Signed)
Pollyann Kennedysaiah Welby JXB:[id number] DOB: 1995-12-16 DOA: 06/09/2020  PCP: Patient, No Pcp Per  Admit date: 06/09/2020  Discharge date: 06/14/2020  Admitted From: Home  Disposition:  Home   Recommendations for Outpatient Follow-up:   Follow up with PCP in 1-2 weeks  PCP Please obtain BMP/CBC, 2 view CXR in 1week,  (see Discharge instructions)   PCP Please follow up on the following pending results: Monitor blood pressure, CBC, CMP and a two-view chest x-ray in 7 to 10 days.  Monitor fasting lipid panel along with BMI.  Needs outpatient sleep study.   Home Health: None Equipment/Devices: 2 lit nasal cannula oxygen on exertion as needed Consultations: None  Discharge Condition: Stable    CODE STATUS: Full    Diet Recommendation: Heart Healthy   Diet Order            Diet - low sodium heart healthy           Diet regular Room service appropriate? Yes; Fluid consistency: Thin  Diet effective now                  Chief Complaint  Patient presents with  . Shortness of Breath    fever     Brief history of present illness from the day of admission and additional interim summary    Duwayne Hecksaiah Mackis a 23 y.o.malewith medical history significant ofmorbid obesity, presented with new onset symptoms of fingers and toes numbness, low-grade fevers and shortness of breath.  In the ER he was diagnosed with acute hypoxic respiratory failure due to COVID-19 pneumonia, CT head, CTA head and neck were unremarkable.  Paresthesias resolved.  He was admitted for treatment of COVID-19 pneumonia.                                                                    Hospital Course   1. Acute Hypoxic Resp. Failure due to Acute Covid 19 Viral Pneumonitis during the ongoing 2020 Covid 19 Pandemic  - he is unfortunately not vaccinated, he  had incurred moderate parenchymal lung injury and was treated with IV steroids and remdesivir, finishing his remdesivir course today thereafter oral steroid taper, rescue inhaler for home.  He has developed mildly productive cough hence I will give him 5-day course of oral Levaquin first dose today in the hospital, overall he is symptom-free and will follow with his PCP soon.   SpO2: 97 % O2 Flow Rate (L/min): 2 L/min  Recent Labs  Lab 06/09/20 1930 06/09/20 2014 06/09/20 2329 06/09/20 2344 06/11/20 0925 06/12/20 1147 06/13/20 0742 06/14/20 0349 06/14/20 0350 06/14/20 0745  WBC  --  8.3  --   --  8.3 16.1* 15.7* 16.2*  --   --   CRP  --   --  11.8*  --  8.5* 5.7* 8.7* 7.0*  --   --   DDIMER  --   --  0.63*  --  0.54* 0.29 0.39 0.50  --   --   BNP  --  17.6  --   --   --  105.8* 89.3  --  92.2  --   PROCALCITON  --   --  0.20  --   --   --   --   --   --  0.17  LATICACIDVEN  --  1.3  --  0.8  --   --   --   --   --   --   AST  --  47*  --   --  54* 109* 92* 96*  --   --   ALT  --  45*  --   --  44 107* 107* 120*  --   --   ALKPHOS  --  121  --   --  110 123 123 127*  --   --   BILITOT  --  0.5  --   --  0.8 0.7 0.5 0.8  --   --   ALBUMIN  --  3.9  --   --  3.4* 3.5 3.3* 3.1*  --   --   INR  --  1.0  --   --   --   --   --   --   --   --   SARSCOV2NAA POSITIVE*  --   --   --   --   --   --   --   --   --       2.  Morbid obesity.  BMI greater than 40.  Follow with PCP for weight loss.  Likely has underlying OSA and OHS as well outpatient sleep study recommended.  3.  Hypertension.  Poorly controlled and has been placed on combination of ACE inhibitor, Norvasc and Coreg.  PCP to monitor and adjust.  Quite likely his blood pressure might come down somewhat once he is off of his steroid taper.   Discharge diagnosis     Active Problems:   Acute hypoxemic respiratory failure due to COVID-19 [facility])    Discharge instructions    Discharge Instructions    Diet - low sodium  heart healthy   Complete by: As directed    Discharge instructions   Complete by: As directed    Follow with Primary MD  in 7 days   Get CBC, CMP, 2 view Chest X ray -  checked next visit within 1 week by Primary MD    Activity: As tolerated with Full fall precautions use walker/cane & assistance as needed  Disposition Home    Diet: Heart Healthy   Special Instructions: If you have smoked or chewed Tobacco  in the last 2 yrs please stop smoking, stop any regular Alcohol  and or any Recreational drug use.  On your next visit with your primary care physician please Get Medicines reviewed and adjusted.  Please request your Prim.MD to go over all Hospital Tests and Procedure/Radiological results at the follow up, please get all Hospital records sent to your Prim MD by signing hospital release before you go home.  If you experience worsening of your admission symptoms, develop shortness of breath, life threatening emergency, suicidal or homicidal thoughts you must seek medical attention immediately by calling 911 or calling your MD immediately  if symptoms less severe.  You Must read complete  instructions/literature along with all the possible adverse reactions/side effects for all the Medicines you take and that have been prescribed to you. Take any new Medicines after you have completely understood and accpet all the possible adverse reactions/side effects.   Increase activity slowly   Complete by: As directed       Discharge Medications   Allergies as of 06/14/2020   No Known Allergies     Medication List    STOP taking these medications   ibuprofen 200 MG tablet Commonly known as: ADVIL     TAKE these medications   acetaminophen 325 MG tablet Commonly known as: TYLENOL Take 650 mg by mouth every 6 (six) hours as needed for mild pain or headache.   albuterol 108 (90 Base) MCG/ACT inhaler Commonly known as: VENTOLIN HFA Inhale 2 puffs into the lungs every 6 (six) hours as  needed for wheezing or shortness of breath.   amLODipine 10 MG tablet Commonly known as: NORVASC Take 1 tablet (10 mg total) by mouth daily. Start taking on: June 15, 2020   carvedilol 12.5 MG tablet Commonly known as: COREG Take 1 tablet (12.5 mg total) by mouth 2 (two) times daily with a meal.   diphenhydrAMINE 25 MG tablet Commonly known as: BENADRYL Take 25 mg by mouth every 6 (six) hours as needed for itching or allergies.   levofloxacin 750 MG tablet Commonly known as: LEVAQUIN Take 1 tablet (750 mg total) by mouth daily.   losartan 50 MG tablet Commonly known as: COZAAR Take 1 tablet (50 mg total) by mouth daily. Start taking on: June 15, 2020   methylPREDNISolone 4 MG Tbpk tablet Commonly known as: MEDROL DOSEPAK follow package directions            Durable Medical Equipment  (From admission, onward)         Start     Ordered   06/13/20 1102  For home use only DME oxygen  Once       Question Answer Comment  Length of Need 6 Months   Mode or (Route) Nasal cannula   Liters per Minute 2   Frequency Continuous (stationary and portable oxygen unit needed)   Oxygen conserving device Yes   Oxygen delivery system Gas      06/13/20 1101           Follow-up Information    Post-COVID Care Clinic at [facility]. Go on 06/24/2020.   Specialty: Family Medicine Why: Appointment made for COVID clinic 06/24/20 @ 2:00 pm.  MUST ATTEND THIS APPOINTMENT. Contact information: [address] [place] [address] [phone]       [facility] Follow up.   Why: Appointment to be made by COVID clinic to establish a PCP. Contact information: [address] [place] [address] [phone]              Major procedures and Radiology Reports - PLEASE review detailed and final reports thoroughly  -        CT Angio Head W/Cm &/Or Wo Cm  Result Date: 06/09/2020 CLINICAL DATA:  Left-sided  facial numbness and tingling EXAM: CT ANGIOGRAPHY HEAD AND NECK TECHNIQUE: Multidetector CT imaging of the head and neck was performed using the standard protocol during bolus administration of intravenous contrast. Multiplanar CT image reconstructions and MIPs were obtained to evaluate the vascular anatomy. Carotid stenosis measurements (when applicable) are obtained utilizing NASCET criteria, using the distal internal carotid diameter as the denominator. CONTRAST:  OMNIPAQUE IOHEXOL 350 MG/ML SOLN COMPARISON:  None. FINDINGS: CTA NECK FINDINGS SKELETON: There is no bony spinal canal stenosis. No lytic or blastic lesion. OTHER NECK: Normal pharynx, larynx and major salivary glands. No cervical lymphadenopathy. Unremarkable thyroid gland. UPPER CHEST: Multiple peripheral predominant ground-glass opacities in the lung apices. AORTIC ARCH: There is no calcific atherosclerosis of the aortic arch. There is no aneurysm, dissection or hemodynamically significant stenosis of the visualized portion of the aorta. Conventional 3 vessel aortic branching pattern. The visualized proximal subclavian arteries are widely patent. RIGHT CAROTID SYSTEM: Normal without aneurysm, dissection or stenosis. LEFT CAROTID SYSTEM: Normal without aneurysm, dissection or stenosis. VERTEBRAL ARTERIES: Assessment limited by body habitus but appear to be patent to the skull base. CTA HEAD FINDINGS POSTERIOR CIRCULATION: --Vertebral arteries: Normal V4 segments. --Inferior cerebellar arteries: Normal. --Basilar artery: Normal. --Superior cerebellar arteries: Normal. --Posterior cerebral arteries (PCA): Normal. ANTERIOR CIRCULATION: --Intracranial internal carotid arteries: Normal. --Anterior cerebral arteries (ACA): Normal. Both A1 segments are present. Patent anterior communicating artery (a-comm). --Middle cerebral arteries (MCA): Normal. VENOUS SINUSES: As permitted by contrast timing, patent. ANATOMIC VARIANTS: None Review of the MIP  images confirms the above findings. IMPRESSION: 1. No emergent large vessel occlusion or high-grade stenosis of the intracranial arteries. 2. Multiple peripheral predominant ground-glass opacities in the lung apices, consistent with viral pneumonia. Electronically Signed   By: Deatra Robinson M.D.   On: 06/09/2020 22:53   CT Head Wo Contrast  Result Date: 06/09/2020 CLINICAL DATA:  Left-sided numbness and tingling. EXAM: CT HEAD WITHOUT CONTRAST TECHNIQUE: Contiguous axial images were obtained from the base of the skull through the vertex without intravenous contrast. COMPARISON:  None. FINDINGS: Brain: There is no mass, hemorrhage or extra-axial collection. The size and configuration of the ventricles and extra-axial CSF spaces are normal. The brain parenchyma is normal, without acute or chronic infarction. Vascular: No abnormal hyperdensity of the major intracranial arteries or dural venous sinuses. No intracranial atherosclerosis. Skull: The visualized skull base, calvarium and extracranial soft tissues are normal. Sinuses/Orbits: No fluid levels or advanced mucosal thickening of the visualized paranasal sinuses. No mastoid or middle ear effusion. The orbits are normal. IMPRESSION: Normal head CT. Electronically Signed   By: Deatra Robinson M.D.   On: 06/09/2020 20:59   CT Angio Neck W and/or Wo Contrast  Result Date: 06/09/2020 CLINICAL DATA:  Left-sided facial numbness and tingling EXAM: CT ANGIOGRAPHY HEAD AND NECK TECHNIQUE: Multidetector CT imaging of the head and neck was performed using the standard protocol during bolus administration of intravenous contrast. Multiplanar CT image reconstructions and MIPs were obtained to evaluate the vascular anatomy. Carotid stenosis measurements (when applicable) are obtained utilizing NASCET criteria, using the distal internal carotid diameter as the denominator. CONTRAST:  OMNIPAQUE IOHEXOL 350 MG/ML SOLN COMPARISON:  None. FINDINGS: CTA NECK FINDINGS  SKELETON: There is no bony spinal canal stenosis. No lytic or blastic lesion. OTHER NECK: Normal pharynx, larynx and major salivary glands. No cervical lymphadenopathy. Unremarkable thyroid gland. UPPER CHEST: Multiple peripheral predominant ground-glass opacities in the lung apices. AORTIC ARCH: There is no calcific atherosclerosis of the aortic arch. There is no aneurysm, dissection or hemodynamically significant stenosis of the visualized portion of the aorta. Conventional 3 vessel aortic branching pattern. The visualized proximal subclavian arteries are widely patent. RIGHT CAROTID SYSTEM: Normal without aneurysm, dissection or stenosis. LEFT CAROTID SYSTEM: Normal without aneurysm, dissection or stenosis. VERTEBRAL ARTERIES: Assessment limited by body habitus but appear to be patent to the skull base. CTA HEAD  FINDINGS POSTERIOR CIRCULATION: --Vertebral arteries: Normal V4 segments. --Inferior cerebellar arteries: Normal. --Basilar artery: Normal. --Superior cerebellar arteries: Normal. --Posterior cerebral arteries (PCA): Normal. ANTERIOR CIRCULATION: --Intracranial internal carotid arteries: Normal. --Anterior cerebral arteries (ACA): Normal. Both A1 segments are present. Patent anterior communicating artery (a-comm). --Middle cerebral arteries (MCA): Normal. VENOUS SINUSES: As permitted by contrast timing, patent. ANATOMIC VARIANTS: None Review of the MIP images confirms the above findings. IMPRESSION: 1. No emergent large vessel occlusion or high-grade stenosis of the intracranial arteries. 2. Multiple peripheral predominant ground-glass opacities in the lung apices, consistent with viral pneumonia. Electronically Signed   By: Deatra Robinson M.D.   On: 06/09/2020 22:53   DG Chest Port 1 View  Result Date: 06/14/2020 CLINICAL DATA:  Productive cough, shortness of breath, COVID-19. EXAM: PORTABLE CHEST 1 VIEW COMPARISON:  June 09, 2020. FINDINGS: The heart size and mediastinal contours are within  normal limits. No pneumothorax or pleural effusion is noted. Bibasilar opacities are noted concerning for subsegmental atelectasis or multifocal pneumonia. The visualized skeletal structures are unremarkable. IMPRESSION: Bibasilar subsegmental atelectasis or multifocal pneumonia. Electronically Signed   By: Lupita Raider M.D.   On: 06/14/2020 08:22   DG Chest Portable 1 View  Result Date: 06/09/2020 CLINICAL DATA:  Shortness of breath. EXAM: PORTABLE CHEST 1 VIEW COMPARISON:  None. FINDINGS: Low lung volumes. Upper normal heart size likely accentuated by portable technique and low lung volumes. Peribronchial thickening versus bronchovascular crowding. No confluent consolidation. No pneumothorax or pleural effusion. No acute osseous abnormalities are seen. Soft tissue attenuation from habitus partially limits assessment. IMPRESSION: 1. Low lung volumes. Upper normal heart size likely accentuated by portable technique and low lung volumes. 2. Peribronchial thickening versus bronchovascular crowding. Electronically Signed   By: Narda Rutherford M.D.   On: 06/09/2020 20:24    Micro Results     Recent Results (from the past 240 hour(s))  Respiratory Panel by RT PCR (Flu A&B, Covid) - Nasopharyngeal Swab     Status: Abnormal   Collection Time: 06/09/20  7:30 PM   Specimen: Nasopharyngeal Swab  Result Value Ref Range Status   SARS Coronavirus 2 by RT PCR POSITIVE (A) NEGATIVE Final    Comment: RESULT CALLED TO, READ BACK BY AND VERIFIED WITH: KELLY NEAL RN @2045  06/09/2020 OLSONM (NOTE) SARS-CoV-2 target nucleic acids are DETECTED.  SARS-CoV-2 RNA is generally detectable in upper respiratory specimens  during the acute phase of infection. Positive results are indicative of the presence of the identified virus, but do not rule out bacterial infection or co-infection with other pathogens not detected by the test. Clinical correlation with patient history and other diagnostic information is  necessary to determine patient infection status. The expected result is Negative.  Fact Sheet for Patients:  06/11/2020  Fact Sheet for Healthcare Providers: https://www.moore.com/  This test is not yet approved or cleared by the https://www.young.biz/ FDA and  has been authorized for detection and/or diagnosis of SARS-CoV-2 by FDA under an Emergency Use Authorization (EUA).  This EUA will remain in effect (meaning this test can  be used) for the duration of  the COVID-19 declaration under Section 564(b)(1) of the Act, 21 U.S.C. section 360bbb-3(b)(1), unless the authorization is terminated or revoked sooner.      Influenza A by PCR NEGATIVE NEGATIVE Final   Influenza B by PCR NEGATIVE NEGATIVE Final    Comment: (NOTE) The Xpert Xpress SARS-CoV-2/FLU/RSV assay is intended as an aid in  the diagnosis of influenza from Nasopharyngeal swab specimens and  should not be used as a sole basis for treatment. Nasal washings and  aspirates are unacceptable for Xpert Xpress SARS-CoV-2/FLU/RSV  testing.  Fact Sheet for Patients: https://www.moore.com/  Fact Sheet for Healthcare Providers: https://www.young.biz/  This test is not yet approved or cleared by the [place] FDA and  has been authorized for detection and/or diagnosis of SARS-CoV-2 by  FDA under an Emergency Use Authorization (EUA). This EUA will remain  in effect (meaning this test can be used) for the duration of the  Covid-19 declaration under Section 564(b)(1) of the Act, 21  U.S.C. section 360bbb-3(b)(1), unless the authorization is  terminated or revoked. Performed at [facility], [address] [place], [place] [address]   Culture, blood (routine x 2)     Status: None   Collection Time: 06/09/20  8:14 PM   Specimen: BLOOD RIGHT ARM  Result Value Ref Range Status   Specimen Description   Final    BLOOD RIGHT  ARM Performed at [facility], [address] [place], [place] [address]    Special Requests   Final    BOTTLES DRAWN AEROBIC AND ANAEROBIC Blood Culture adequate volume Performed at [facility], [address] [place], [place] [address]    Culture   Final    NO GROWTH 5 DAYS Performed at [facility], [address] [place], [place] [address]    Report Status 06/14/2020 FINAL  Final    Today   Subjective    Severus Brodzinski today has no headache,no chest abdominal pain,no new weakness tingling or numbness, feels much better wants to go home today.     Objective   Blood pressure 139/84, pulse 89, temperature 98.7 F (37.1 C), temperature source Oral, resp. rate 18, height 5' 10.5" (1.791 m), weight (!) 183.3 kg, SpO2 97 %.   Intake/Output Summary (Last 24 hours) at 06/14/2020 1034 Last data filed at 06/14/2020 0900 Gross per 24 hour  Intake 240 ml  Output --  Net 240 ml    Exam  Awake Alert, No new F.N deficits, Normal affect [place].AT,PERRAL Supple Neck,No JVD, No cervical lymphadenopathy appriciated.  Symmetrical Chest wall movement, Good air movement bilaterally, CTAB RRR,No Gallops,Rubs or new Murmurs, No Parasternal Heave +ve B.Sounds, Abd Soft, Non tender, No organomegaly appriciated, No rebound -guarding or rigidity. No Cyanosis, Clubbing or edema, No new Rash or bruise   Data Review   CBC w Diff:  Lab Results  Component Value Date   WBC 16.2 (H) 06/14/2020   HGB 13.9 06/14/2020   HCT 44.4 06/14/2020   PLT 297 06/14/2020   LYMPHOPCT 13 06/14/2020   MONOPCT 7 06/14/2020   EOSPCT 0 06/14/2020   BASOPCT 0 06/14/2020    CMP:  Lab Results  Component Value Date   NA 133 (L) 06/14/2020   K 4.1 06/14/2020   CL 96 (L) 06/14/2020   CO2 26 06/14/2020   BUN 16 06/14/2020   CREATININE 1.21 06/14/2020   PROT 7.3 06/14/2020   ALBUMIN 3.1 (L) 06/14/2020   BILITOT 0.8 06/14/2020   ALKPHOS 127 (H) 06/14/2020   AST 96 (H) 06/14/2020    ALT 120 (H) 06/14/2020  .   Total Time in preparing paper work, data evaluation and todays exam - 35 minutes  Susa Raring M.D on 06/14/2020 at 10:34 AM  [facility]   Office  [phone]

## 2020-06-14 NOTE — Discharge Instructions (Signed)
Follow with Primary MD  in 7 days   Get CBC, CMP, 2 view Chest X ray -  checked next visit within 1 week by Primary MD    Activity: As tolerated with Full fall precautions use walker/cane & assistance as needed  Disposition Home    Diet: Heart Healthy   Special Instructions: If you have smoked or chewed Tobacco  in the last 2 yrs please stop smoking, stop any regular Alcohol  and or any Recreational drug use.  On your next visit with your primary care physician please Get Medicines reviewed and adjusted.  Please request your Prim.MD to go over all Hospital Tests and Procedure/Radiological results at the follow up, please get all Hospital records sent to your Prim MD by signing hospital release before you go home.  If you experience worsening of your admission symptoms, develop shortness of breath, life threatening emergency, suicidal or homicidal thoughts you must seek medical attention immediately by calling 911 or calling your MD immediately  if symptoms less severe.  You Must read complete instructions/literature along with all the possible adverse reactions/side effects for all the Medicines you take and that have been prescribed to you. Take any new Medicines after you have completely understood and accpet all the possible adverse reactions/side effects.       Person Under Monitoring Name: Frank Knight  Location: [address] [place] [address]   Infection Prevention Recommendations for Individuals Confirmed to have, or Being Evaluated for, 2019 Novel Coronavirus (COVID-19) Infection Who Receive Care at Home  Individuals who are confirmed to have, or are being evaluated for, COVID-19 should follow the prevention steps below until a healthcare provider or local or state health department says they can return to normal activities.  Stay home except to get medical care You should restrict activities outside your home, except for getting medical care. Do not go to  work, school, or public areas, and do not use public transportation or taxis.  Call ahead before visiting your doctor Before your medical appointment, call the healthcare provider and tell them that you have, or are being evaluated for, COVID-19 infection. This will help the healthcare provider's office take steps to keep other people from getting infected. Ask your healthcare provider to call the local or state health department.  Monitor your symptoms Seek prompt medical attention if your illness is worsening (e.g., difficulty breathing). Before going to your medical appointment, call the healthcare provider and tell them that you have, or are being evaluated for, COVID-19 infection. Ask your healthcare provider to call the local or state health department.  Wear a facemask You should wear a facemask that covers your nose and mouth when you are in the same room with other people and when you visit a healthcare provider. People who live with or visit you should also wear a facemask while they are in the same room with you.  Separate yourself from other people in your home As much as possible, you should stay in a different room from other people in your home. Also, you should use a separate bathroom, if available.  Avoid sharing household items You should not share dishes, drinking glasses, cups, eating utensils, towels, bedding, or other items with other people in your home. After using these items, you should wash them thoroughly with soap and water.  Cover your coughs and sneezes Cover your mouth and nose with a tissue when you cough or sneeze, or you can cough or sneeze  into your sleeve. Throw used tissues in a lined trash can, and immediately wash your hands with soap and water for at least 20 seconds or use an alcohol-based hand rub.  Wash your [employer] your hands often and thoroughly with soap and water for at least 20 seconds. You can use an alcohol-based hand sanitizer if  soap and water are not available and if your hands are not visibly dirty. Avoid touching your eyes, nose, and mouth with unwashed hands.   Prevention Steps for Caregivers and Household Members of Individuals Confirmed to have, or Being Evaluated for, COVID-19 Infection Being Cared for in the Home  If you live with, or provide care at home for, a person confirmed to have, or being evaluated for, COVID-19 infection please follow these guidelines to prevent infection:  Follow healthcare provider's instructions Make sure that you understand and can help the patient follow any healthcare provider instructions for all care.  Provide for the patient's basic needs You should help the patient with basic needs in the home and provide support for getting groceries, prescriptions, and other personal needs.  Monitor the patient's symptoms If they are getting sicker, call his or her medical provider and tell them that the patient has, or is being evaluated for, COVID-19 infection. This will help the healthcare provider's office take steps to keep other people from getting infected. Ask the healthcare provider to call the local or state health department.  Limit the number of people who have contact with the patient  If possible, have only one caregiver for the patient.  Other household members should stay in another home or place of residence. If this is not possible, they should stay  in another room, or be separated from the patient as much as possible. Use a separate bathroom, if available.  Restrict visitors who do not have an essential need to be in the home.  Keep older adults, very young children, and other sick people away from the patient Keep older adults, very young children, and those who have compromised immune systems or chronic health conditions away from the patient. This includes people with chronic heart, lung, or kidney conditions, diabetes, and cancer.  Ensure good  ventilation Make sure that shared spaces in the home have good air flow, such as from an air conditioner or an opened window, weather permitting.  Wash your hands often  Wash your hands often and thoroughly with soap and water for at least 20 seconds. You can use an alcohol based hand sanitizer if soap and water are not available and if your hands are not visibly dirty.  Avoid touching your eyes, nose, and mouth with unwashed hands.  Use disposable paper towels to dry your hands. If not available, use dedicated cloth towels and replace them when they become wet.  Wear a facemask and gloves  Wear a disposable facemask at all times in the room and gloves when you touch or have contact with the patient's blood, body fluids, and/or secretions or excretions, such as sweat, saliva, sputum, nasal mucus, vomit, urine, or feces.  Ensure the mask fits over your nose and mouth tightly, and do not touch it during use.  Throw out disposable facemasks and gloves after using them. Do not reuse.  Wash your hands immediately after removing your facemask and gloves.  If your personal clothing becomes contaminated, carefully remove clothing and launder. Wash your hands after handling contaminated clothing.  Place all used disposable facemasks, gloves, and other waste in  a lined container before disposing them with other household waste.  Remove gloves and wash your hands immediately after handling these items.  Do not share dishes, glasses, or other household items with the patient  Avoid sharing household items. You should not share dishes, drinking glasses, cups, eating utensils, towels, bedding, or other items with a patient who is confirmed to have, or being evaluated for, COVID-19 infection.  After the person uses these items, you should wash them thoroughly with soap and water.  Wash laundry thoroughly  Immediately remove and wash clothes or bedding that have blood, body fluids, and/or  secretions or excretions, such as sweat, saliva, sputum, nasal mucus, vomit, urine, or feces, on them.  Wear gloves when handling laundry from the patient.  Read and follow directions on labels of laundry or clothing items and detergent. In general, wash and dry with the warmest temperatures recommended on the label.  Clean all areas the individual has used often  Clean all touchable surfaces, such as counters, tabletops, doorknobs, bathroom fixtures, toilets, phones, keyboards, tablets, and bedside tables, every day. Also, clean any surfaces that may have blood, body fluids, and/or secretions or excretions on them.  Wear gloves when cleaning surfaces the patient has come in contact with.  Use a diluted bleach solution (e.g., dilute bleach with 1 part bleach and 10 parts water) or a household disinfectant with a label that says EPA-registered for coronaviruses. To make a bleach solution at home, add 1 tablespoon of bleach to 1 quart (4 cups) of water. For a larger supply, add  cup of bleach to 1 gallon (16 cups) of water.  Read labels of cleaning products and follow recommendations provided on product labels. Labels contain instructions for safe and effective use of the cleaning product including precautions you should take when applying the product, such as wearing gloves or eye protection and making sure you have good ventilation during use of the product.  Remove gloves and wash hands immediately after cleaning.  Monitor yourself for signs and symptoms of illness Caregivers and household members are considered close contacts, should monitor their health, and will be asked to limit movement outside of the home to the extent possible. Follow the monitoring steps for close contacts listed on the symptom monitoring form.   ? If you have additional questions, contact your local health department or call the epidemiologist on call at [phone] (available 24/7). ? This guidance is subject  to change. For the most up-to-date guidance from [facility], please refer to their website: [email or web address]

## 2020-06-14 NOTE — Progress Notes (Signed)
SATURATION QUALIFICATIONS: (This note is used to comply with regulatory documentation for home oxygen)  Patient Saturations on Room Air at Rest = 90%  Patient Saturations on Room Air while Ambulating = 77%  Patient Saturations on 2Liters of oxygen while Ambulating = 94%  Please briefly explain why patient needs home oxygen: Patient desat really fast while walking. Dyspnea noted.

## 2020-06-14 NOTE — TOC Progression Note (Signed)
Transition of Care [facility]) - Progression Note    Patient Details  Name: Frank Knight MRN: [id number] Date of Birth: [birth year]  Transition of [facility] CM/SW Contact  Deveron Furlong, RN 06/14/2020, 12:06 PM  Clinical Narrative:    Patient needs to d/c with home O2.  Patient is uninsured.  Requested [employer] to assess patient for charity program.      Expected Discharge Plan: Home/Self Care Barriers to Discharge: Continued Medical Work up  Expected Discharge Plan and Services Expected Discharge Plan: Home/Self Care   Discharge Planning Services: CM Consult   Living arrangements for the past 2 months: Apartment Expected Discharge Date: 06/14/20               DME Arranged: Oxygen DME Agency: [employer] Date DME Agency Contacted: 06/14/20 Time DME Agency Contacted: 1145 Representative spoke with at DME Agency: Velna Hatchet             Social Determinants of Health (SDOH) Interventions    Readmission Risk Interventions No flowsheet data found.

## 2020-06-14 NOTE — Progress Notes (Signed)
°  °                                    [facility]                            [address]                            [place], [place] [address]      Frank Knight was admitted to the Hospital on 06/09/2020 and Discharged  06/14/2020 and should be excused from work/school   for 14 days starting from date -  06/09/2020 , may return to work/school without any restrictions.  Call Susa Raring MD, [facility]  [phone] with questions.  Susa Raring M.D on 06/14/2020,at 12:10 PM  [facility]   Office  [phone]

## 2020-06-14 NOTE — Progress Notes (Signed)
ANTICOAGULATION CONSULT NOTE  Pharmacy Consult for lovenox Indication: VTE prophylaxis  Labs: Recent Labs    06/12/20 1147 06/12/20 1147 06/13/20 0742 06/14/20 0349  HGB 14.4   < > 14.1 13.9  HCT 46.1  --  43.9 44.4  PLT 295  --  317 297  CREATININE 1.05  --  1.00 1.21   < > = values in this interval not displayed.    Assessment: 24 year old male presenting COVID-19 positive. Pharmacy consulted to dose Lovenox for VTE prophylaxis. CBC wnl, SCr stable 1.01. D-dimer <1, stable. Patient is not on anticoagulation PTA. Weight updated this admit as 183.3 kg. No bleeding noted.  Goal of Therapy:  VTE prevention Monitor platelets by anticoagulation protocol: Yes   Plan:  - Continue Lovenox to 90mg  (0.5mg /kg)  q24h - Monitor CBC, s/sx bleeding     Samina Weekes L. , PharmD [facility] PGY2 Pharmacy Resident Weekends 7:00 am - 3:00 pm, please call [phone] 06/14/20      7:42 AM  Please check AMION for all [facility] Pharmacy phone numbers After 10:00 PM, call the Main Pharmacy [phone]

## 2020-06-24 ENCOUNTER — Ambulatory Visit: Payer: Medicaid Other

## 2021-02-17 ENCOUNTER — Emergency Department (HOSPITAL_BASED_OUTPATIENT_CLINIC_OR_DEPARTMENT_OTHER): Payer: Managed Care, Other (non HMO)

## 2021-02-17 ENCOUNTER — Other Ambulatory Visit: Payer: Self-pay

## 2021-02-17 ENCOUNTER — Emergency Department (HOSPITAL_BASED_OUTPATIENT_CLINIC_OR_DEPARTMENT_OTHER)
Admission: EM | Admit: 2021-02-17 | Discharge: 2021-02-17 | Disposition: A | Discharge status: Home / Self Care | Payer: Managed Care, Other (non HMO) | Attending: Emergency Medicine | Admitting: Emergency Medicine

## 2021-02-17 ENCOUNTER — Encounter (HOSPITAL_BASED_OUTPATIENT_CLINIC_OR_DEPARTMENT_OTHER): Payer: Self-pay

## 2021-02-17 DIAGNOSIS — R0981 Nasal congestion: Secondary | ICD-10-CM | POA: Diagnosis not present

## 2021-02-17 DIAGNOSIS — Z20822 Contact with and (suspected) exposure to covid-19: Secondary | ICD-10-CM | POA: Insufficient documentation

## 2021-02-17 DIAGNOSIS — R059 Cough, unspecified: Secondary | ICD-10-CM | POA: Diagnosis not present

## 2021-02-17 DIAGNOSIS — Z79899 Other long term (current) drug therapy: Secondary | ICD-10-CM | POA: Insufficient documentation

## 2021-02-17 DIAGNOSIS — Z28310 Unvaccinated for covid-19: Secondary | ICD-10-CM | POA: Diagnosis not present

## 2021-02-17 DIAGNOSIS — Z8616 Personal history of COVID-19: Secondary | ICD-10-CM | POA: Diagnosis not present

## 2021-02-17 DIAGNOSIS — J029 Acute pharyngitis, unspecified: Secondary | ICD-10-CM | POA: Diagnosis not present

## 2021-02-17 LAB — CBC WITH DIFFERENTIAL/PLATELET
Abs Immature Granulocytes: 0.03 10*3/uL (ref 0.00–0.07)
Basophils Absolute: 0 10*3/uL (ref 0.0–0.1)
Basophils Relative: 0 %
Eosinophils Absolute: 0.3 10*3/uL (ref 0.0–0.5)
Eosinophils Relative: 3 %
HCT: 42.7 % (ref 39.0–52.0)
Hemoglobin: 13.9 g/dL (ref 13.0–17.0)
Immature Granulocytes: 0 %
Lymphocytes Relative: 30 %
Lymphs Abs: 3.1 10*3/uL (ref 0.7–4.0)
MCH: 29.3 pg (ref 26.0–34.0)
MCHC: 32.6 g/dL (ref 30.0–36.0)
MCV: 90.1 fL (ref 80.0–100.0)
Monocytes Absolute: 1 10*3/uL (ref 0.1–1.0)
Monocytes Relative: 9 %
Neutro Abs: 6 10*3/uL (ref 1.7–7.7)
Neutrophils Relative %: 58 %
Platelets: 345 10*3/uL (ref 150–400)
RBC: 4.74 MIL/uL (ref 4.22–5.81)
RDW: 13.6 % (ref 11.5–15.5)
WBC: 10.4 10*3/uL (ref 4.0–10.5)
nRBC: 0 % (ref 0.0–0.2)

## 2021-02-17 LAB — BASIC METABOLIC PANEL
Anion gap: 9 (ref 5–15)
BUN: 13 mg/dL (ref 6–20)
CO2: 25 mmol/L (ref 22–32)
Calcium: 9.1 mg/dL (ref 8.9–10.3)
Chloride: 104 mmol/L (ref 98–111)
Creatinine, Ser: 1.31 mg/dL — ABNORMAL HIGH (ref 0.61–1.24)
GFR, Estimated: >60 mL/min (ref >60)
Glucose, Bld: 97 mg/dL (ref 70–99)
Potassium: 4.2 mmol/L (ref 3.5–5.1)
Sodium: 138 mmol/L (ref 135–145)

## 2021-02-17 LAB — RESP PANEL BY RT-PCR (FLU A&B, COVID) ARPGX2
Influenza A by PCR: NEGATIVE
Influenza B by PCR: NEGATIVE
SARS Coronavirus 2 by RT PCR: NEGATIVE

## 2021-02-17 MED ORDER — PROMETHAZINE-DM 6.25-15 MG/5ML PO SYRP: 5.0000 mL | ORAL_SOLUTION | Freq: Four times a day (QID) | ORAL | 0 refills | Status: AC | PRN

## 2021-02-17 MED ORDER — BENZONATATE 100 MG PO CAPS: 100.0000 mg | ORAL_CAPSULE | Freq: Three times a day (TID) | ORAL | 0 refills | Status: AC

## 2021-02-17 MED ORDER — FLUTICASONE PROPIONATE 50 MCG/ACT NA SUSP: 2.0000 | Freq: Every day | NASAL | 0 refills | Status: AC

## 2021-02-17 MED ORDER — ZYRTEC ALLERGY 10 MG PO CAPS: 10.0000 mg | ORAL_CAPSULE | Freq: Every day | ORAL | 0 refills | Status: AC

## 2021-02-17 MED ORDER — IBUPROFEN 400 MG PO TABS
600.0000 mg | ORAL_TABLET | Freq: Once | ORAL | Status: AC
  Administered 2021-02-17: 600 mg via ORAL
  Filled 2021-02-17: qty 1

## 2021-02-17 NOTE — ED Triage Notes (Signed)
Pt c/o flu like sx x 3 days-NAD-steady gait ?

## 2021-02-17 NOTE — Discharge Instructions (Addendum)
Your COVID and influenza were negative.  Your lab work was unremarkable and your chest x-ray was without any evidence of infection.  I strongly recommend you get vaccinated for COVID-19 given that you were hospitalized for numerous days in September.  Please drink plenty of water.  Please use Tylenol and ibuprofen for your symptoms.  Please take as directed below.    Please use Tylenol or ibuprofen for pain.  You may use 600 mg ibuprofen every 6 hours or 1000 mg of Tylenol every 6 hours.  You may choose to alternate between the 2.  This would be most effective.  Not to exceed 4 g of Tylenol within 24 hours.  Not to exceed 3200 mg ibuprofen 24 hours.   I have also prescribed to cough medications for you.

## 2021-02-17 NOTE — ED Provider Notes (Addendum)
[facility] EMERGENCY DEPARTMENT Provider Note   CSN: [id number] Arrival date & time: 02/17/21  1714     History Chief Complaint  Patient presents with  . Cough    Frank Knight is a 25 y.o. male.  HPI   Patient is a 25 year old male with past medical history significant for obesity.  Patient states that he is here in the ER for cough, congestion, runny nose, sore throat all started on Saturday.  He denies any chest pain or shortness of breath.  No lightheadedness or dizziness.  He states he was diagnosed with COVID in September 2021 and was admitted because he was hypoxic.  On my review of EMR it appears that patient was in the hospital for 5 days because of his symptoms.  He states he is still not vaccinated for COVID-19.  Denies any nausea vomiting chest pain shortness of breath diaphoresis lightheadedness or dizziness. He states he came to the ER because of how sick he got last night he had COVID-19 and is worried about his diagnosis today.  He has taken no medications today apart from a dose of Tylenol greater than 6 hours ago.  He states he is also use some Mucinex.  Use ibuprofen several days ago.     Past Medical History:  Diagnosis Date  . Obesity     Patient Active Problem List   Diagnosis Date Noted  . Acute hypoxemic respiratory failure due to COVID-19 [facility]) 06/09/2020    History reviewed. No pertinent surgical history.     No family history on file.  Social History   Tobacco Use  . Smoking status: Never Smoker  . Smokeless tobacco: Never Used  Substance Use Topics  . Alcohol use: No  . Drug use: No    Home Medications Prior to Admission medications   Medication Sig Start Date End Date Taking? Authorizing Provider  benzonatate (TESSALON) 100 MG capsule Take 1 capsule (100 mg total) by mouth every 8 (eight) hours. 02/17/21  Yes Judythe Postema, Stevphen Meuse S, PA  Cetirizine HCl (ZYRTEC ALLERGY) 10 MG CAPS Take 1 capsule (10 mg total) by mouth daily for 21  days. 02/17/21 03/10/21 Yes Giannis Corpuz S, PA  fluticasone (FLONASE) 50 MCG/ACT nasal spray Place 2 sprays into both nostrils daily for 14 days. 02/17/21 03/03/21 Yes Caitlin Ainley S, PA  promethazine-dextromethorphan (PROMETHAZINE-DM) 6.25-15 MG/5ML syrup Take 5 mLs by mouth 4 (four) times daily as needed for cough. 02/17/21  Yes Kimberlea Schlag, Stevphen Meuse S, PA  acetaminophen (TYLENOL) 325 MG tablet Take 650 mg by mouth every 6 (six) hours as needed for mild pain or headache.    [provider]  albuterol (VENTOLIN HFA) 108 (90 Base) MCG/ACT inhaler Inhale 2 puffs into the lungs every 6 (six) hours as needed for wheezing or shortness of breath. 06/14/20   Leroy Sea, MD  amLODipine (NORVASC) 10 MG tablet Take 1 tablet (10 mg total) by mouth daily. 06/15/20   Leroy Sea, MD  carvedilol (COREG) 12.5 MG tablet Take 1 tablet (12.5 mg total) by mouth 2 (two) times daily with a meal. 06/14/20   Leroy Sea, MD  diphenhydrAMINE (BENADRYL) 25 MG tablet Take 25 mg by mouth every 6 (six) hours as needed for itching or allergies.    [provider]  levofloxacin (LEVAQUIN) 750 MG tablet Take 1 tablet (750 mg total) by mouth daily. 06/14/20   Leroy Sea, MD  losartan (COZAAR) 50 MG tablet Take 1 tablet (50 mg total) by  mouth daily. 06/15/20   Leroy Sea, MD  methylPREDNISolone (MEDROL DOSEPAK) 4 MG TBPK tablet follow package directions 06/14/20   Leroy Sea, MD    Allergies    Patient has no known allergies.  Review of Systems   Review of Systems  Constitutional: Positive for fatigue. Negative for chills and fever.  HENT: Positive for congestion.   Eyes: Negative for pain.  Respiratory: Positive for cough. Negative for shortness of breath.   Cardiovascular: Negative for chest pain and leg swelling.  Gastrointestinal: Negative for abdominal pain and vomiting.  Genitourinary: Negative for dysuria.  Musculoskeletal: Negative for myalgias and neck pain.  Skin:  Negative for rash.  Neurological: Negative for dizziness and headaches.    Physical Exam Updated Vital Signs BP (!) 156/92 (BP Location: Right Arm)   Pulse 92   Temp 97.9 F (36.6 C) (Oral)   Resp 20   Ht 5\' 11"  (1.803 m)   Wt (!) 192.3 kg   SpO2 98%   BMI 59.14 kg/m   Physical Exam Vitals and nursing note reviewed.  Constitutional:      General: He is not in acute distress.    Appearance: He is obese.     Comments: Morbidly obese 25 year old male coughing frequently.  Speaking full sentences.  No tachypnea.  HENT:     Head: Normocephalic and atraumatic.     Nose: Nose normal.  Eyes:     General: No scleral icterus. Cardiovascular:     Rate and Rhythm: Normal rate and regular rhythm.     Pulses: Normal pulses.     Heart sounds: Normal heart sounds.  Pulmonary:     Effort: Pulmonary effort is normal. No respiratory distress.     Breath sounds: Normal breath sounds. No wheezing.     Comments: Lungs are clear to auscultation all fields.  Speaking full sentences. Abdominal:     Palpations: Abdomen is soft.     Tenderness: There is no abdominal tenderness. There is no guarding or rebound.  Musculoskeletal:     Cervical back: Normal range of motion.     Right lower leg: No edema.     Left lower leg: No edema.     Comments: No lower extremity edema.  No calf tenderness.  Skin:    General: Skin is warm and dry.     Capillary Refill: Capillary refill takes less than 2 seconds.  Neurological:     Mental Status: He is alert. Mental status is at baseline.  Psychiatric:        Mood and Affect: Mood normal.        Behavior: Behavior normal.     ED Results / Procedures / Treatments   Labs (all labs ordered are listed, but only abnormal results are displayed) Labs Reviewed  BASIC METABOLIC PANEL - Abnormal; Notable for the following components:      Result Value   Creatinine, Ser 1.31 (*)    All other components within normal limits  RESP PANEL BY RT-PCR (FLU A&B,  COVID) ARPGX2  CBC WITH DIFFERENTIAL/PLATELET    EKG None  Radiology DG Chest Port 1 View  Result Date: 02/17/2021 CLINICAL DATA:  Chest pain and flu like symptoms for 3 days EXAM: PORTABLE CHEST 1 VIEW COMPARISON:  06/14/2020 FINDINGS: The heart size and mediastinal contours are within normal limits. Lungs are suboptimally inflated. No pleural effusion or edema. No airspace densities. The visualized skeletal structures are unremarkable. IMPRESSION: No active cardiopulmonary abnormalities. Electronically Signed  By: Signa Kell M.D.   On: 02/17/2021 20:39    Procedures Procedures   Medications Ordered in ED Medications  ibuprofen (ADVIL) tablet 600 mg (600 mg Oral Given 02/17/21 2026)    ED Course  I have reviewed the triage vital signs and the nursing notes.  Pertinent labs & imaging results that were available during my care of the patient were reviewed by me and considered in my medical decision making (see chart for details).  Clinical Course as of 02/17/21 2219  Tue Feb 17, 2021  2217 BMP unremarkable.  CBC for leukocytosis or anemia.  COVID influenza negative.  Chest x-ray without infiltrate.  Patient's tachycardia resolved with 1 dose of ibuprofen.  He states he feels improved.  Will send home with 2 antitussives and Tylenol and ibuprofen recommendations.  Strongly recommended vaccination for COVID-19. [WF]    Clinical Course User Index [WF] Gailen Shelter, [place]   MDM Rules/Calculators/A&P                          Patient is 25 year old male with past medical history of severe COVID-pneumonia back in September 2021.  He is here today with symptoms concerning for COVID-19.  He is still unvaccinated.  We will obtain BMP CBC chest x-ray and COVID influenza testing.  If he is positive would qualify for antivirals.  Patient reevaluated after ibuprofen.  He is without any tachycardia now.  Overall reassuring physical exam.  See clinical course user index for review of labs  and imaging.  Will discharge home with Flonase, Zyrtec and 2 antitussives.  Return precautions given.  Patient understanding of plan and agreeable to plan.  Final Clinical Impression(s) / ED Diagnoses Final diagnoses:  Cough  Frank Knight was evaluated in Emergency Department on 02/17/2021 for the symptoms described in the history of present illness. He was evaluated in the context of the global COVID-19 pandemic, which necessitated consideration that the patient might be at risk for infection with the SARS-CoV-2 virus that causes COVID-19. Institutional protocols and algorithms that pertain to the evaluation of patients at risk for COVID-19 are in a state of rapid change based on information released by regulatory bodies including the CDC and federal and state organizations. These policies and algorithms were followed during the patient's care in the ED.   Rx / DC Orders ED Discharge Orders         Ordered    promethazine-dextromethorphan (PROMETHAZINE-DM) 6.25-15 MG/5ML syrup  4 times daily PRN        02/17/21 2215    benzonatate (TESSALON) 100 MG capsule  Every 8 hours        02/17/21 2215    Cetirizine HCl (ZYRTEC ALLERGY) 10 MG CAPS  Daily        02/17/21 2219    fluticasone (FLONASE) 50 MCG/ACT nasal spray  Daily        02/17/21 2219           Gailen Shelter, [place] 02/17/21 2221    Gailen Shelter, [place] 02/17/21 2221    Pricilla Loveless, MD 02/18/21 1600

## 2021-06-21 IMAGING — CT CT ANGIO NECK
2 of 8 series · 8 of 36 positions shown · IV contrast (omnipaque)
Comparison: None.

CLINICAL DATA: Left-sided facial numbness and tingling

EXAM:
CT ANGIOGRAPHY HEAD AND NECK
TECHNIQUE: Multidetector CT imaging of the head and neck was performed using
the standard protocol during bolus administration of intravenous
contrast. Multiplanar CT image reconstructions and MIPs were
obtained to evaluate the vascular anatomy. Carotid stenosis
measurements (when applicable) are obtained utilizing NASCET
criteria, using the distal internal carotid diameter as the
denominator.
CONTRAST:  100mL OMNIPAQUE IOHEXOL 350 MG/ML SOLN

[Series 8: axial thin · axial · 0.48mm/px · z∈[+995,+1246]mm · 6 of 353 slices shown]
[im 51/353  soft-tissue]
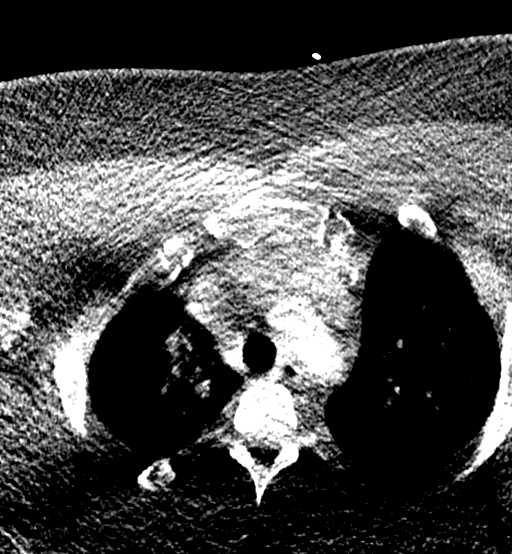
[im 101/353  bone]
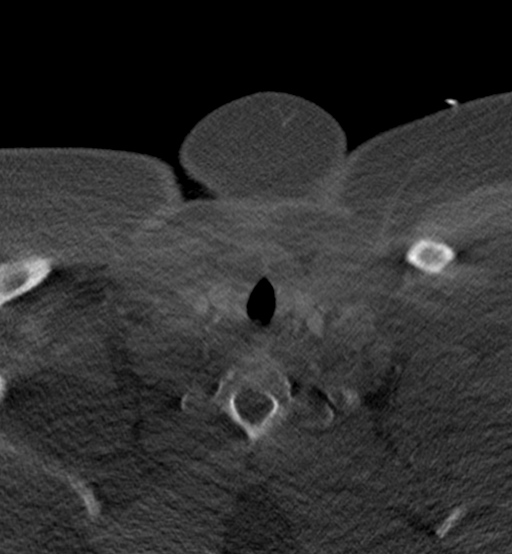
[im 151/353  soft-tissue]
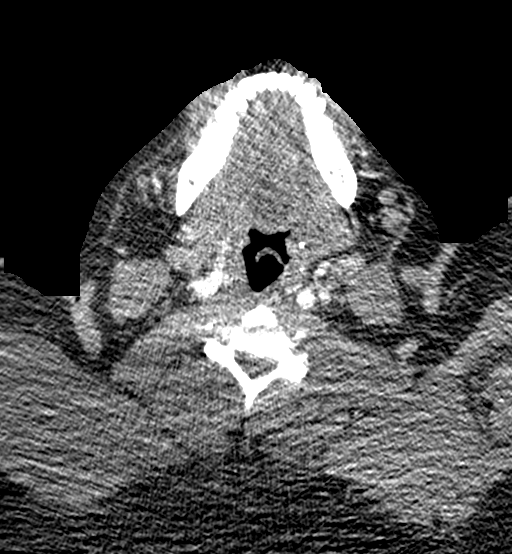
[im 202/353  bone]
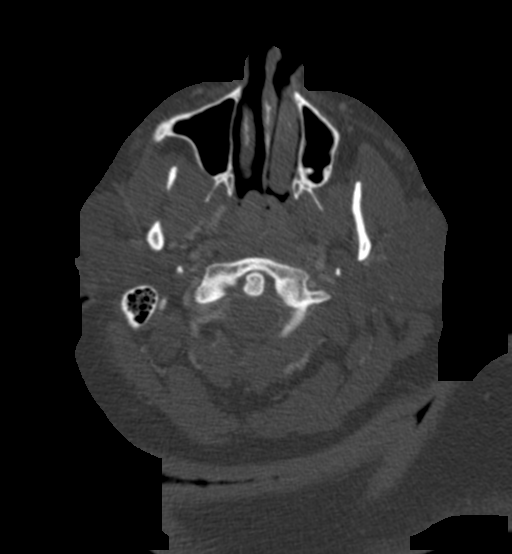
[im 252/353  soft-tissue]
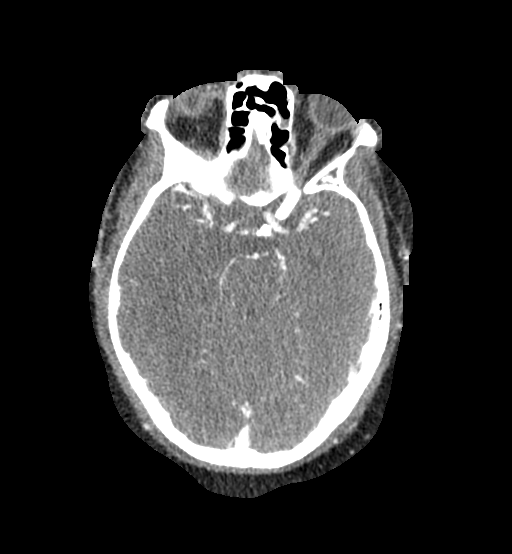
[im 302/353  bone]
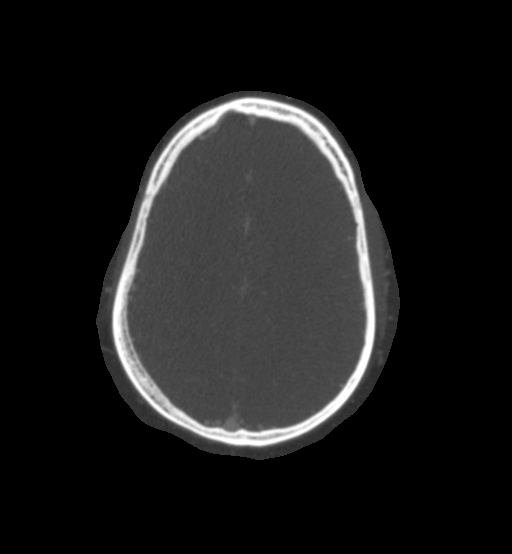

[Series 10: sagittal thin · sagittal · 0.51mm/px · 2 of 234 slices shown]
[im 68/234  soft-tissue]
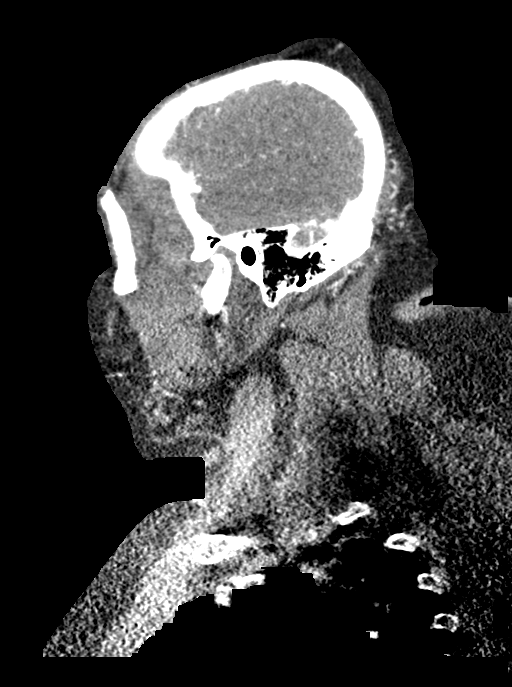
[im 167/234  soft-tissue]
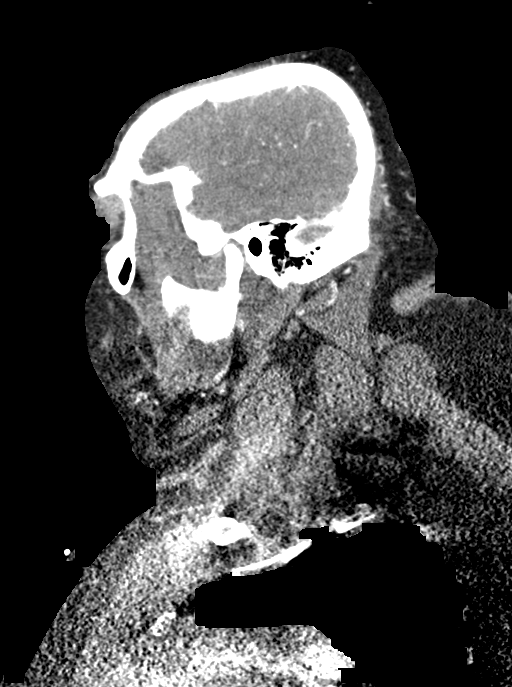

[8 of 36 positions shown; findings below may reference images not displayed]

FINDINGS: CTA NECK FINDINGS

SKELETON: There is no bony spinal canal stenosis. No lytic or
blastic lesion.

OTHER NECK: Normal pharynx, larynx and major salivary glands. No
cervical lymphadenopathy. Unremarkable thyroid gland.

UPPER CHEST: Multiple peripheral predominant ground-glass opacities
in the lung apices.

AORTIC ARCH:

There is no calcific atherosclerosis of the aortic arch. There is no
aneurysm, dissection or hemodynamically significant stenosis of the
visualized portion of the aorta. Conventional 3 vessel aortic
branching pattern. The visualized proximal subclavian arteries are
widely patent.

RIGHT CAROTID SYSTEM: Normal without aneurysm, dissection or
stenosis.

LEFT CAROTID SYSTEM: Normal without aneurysm, dissection or
stenosis.

VERTEBRAL ARTERIES: Assessment limited by body habitus but appear to
be patent to the skull base.

CTA HEAD FINDINGS

POSTERIOR CIRCULATION:

--Vertebral arteries: Normal V4 segments.

--Inferior cerebellar arteries: Normal.

--Basilar artery: Normal.

--Superior cerebellar arteries: Normal.

--Posterior cerebral arteries (PCA): Normal.

ANTERIOR CIRCULATION:

--Intracranial internal carotid arteries: Normal.

--Anterior cerebral arteries (ACA): Normal. Both A1 segments are
present. Patent anterior communicating artery (a-comm).

--Middle cerebral arteries (MCA): Normal.

VENOUS SINUSES: As permitted by contrast timing, patent.

ANATOMIC VARIANTS: None

Review of the MIP images confirms the above findings.
IMPRESSION: 1. No emergent large vessel occlusion or high-grade stenosis of the
intracranial arteries.
2. Multiple peripheral predominant ground-glass opacities in the
lung apices, consistent with viral pneumonia.

## 2022-03-01 IMAGING — DX DG CHEST 1V PORT
1 series · 1 of 1 positions shown · non-contrast
Comparison: 06/14/2020

CLINICAL DATA: Chest pain and flu like symptoms for 3 days

EXAM:
PORTABLE CHEST 1 VIEW

[chest ap]
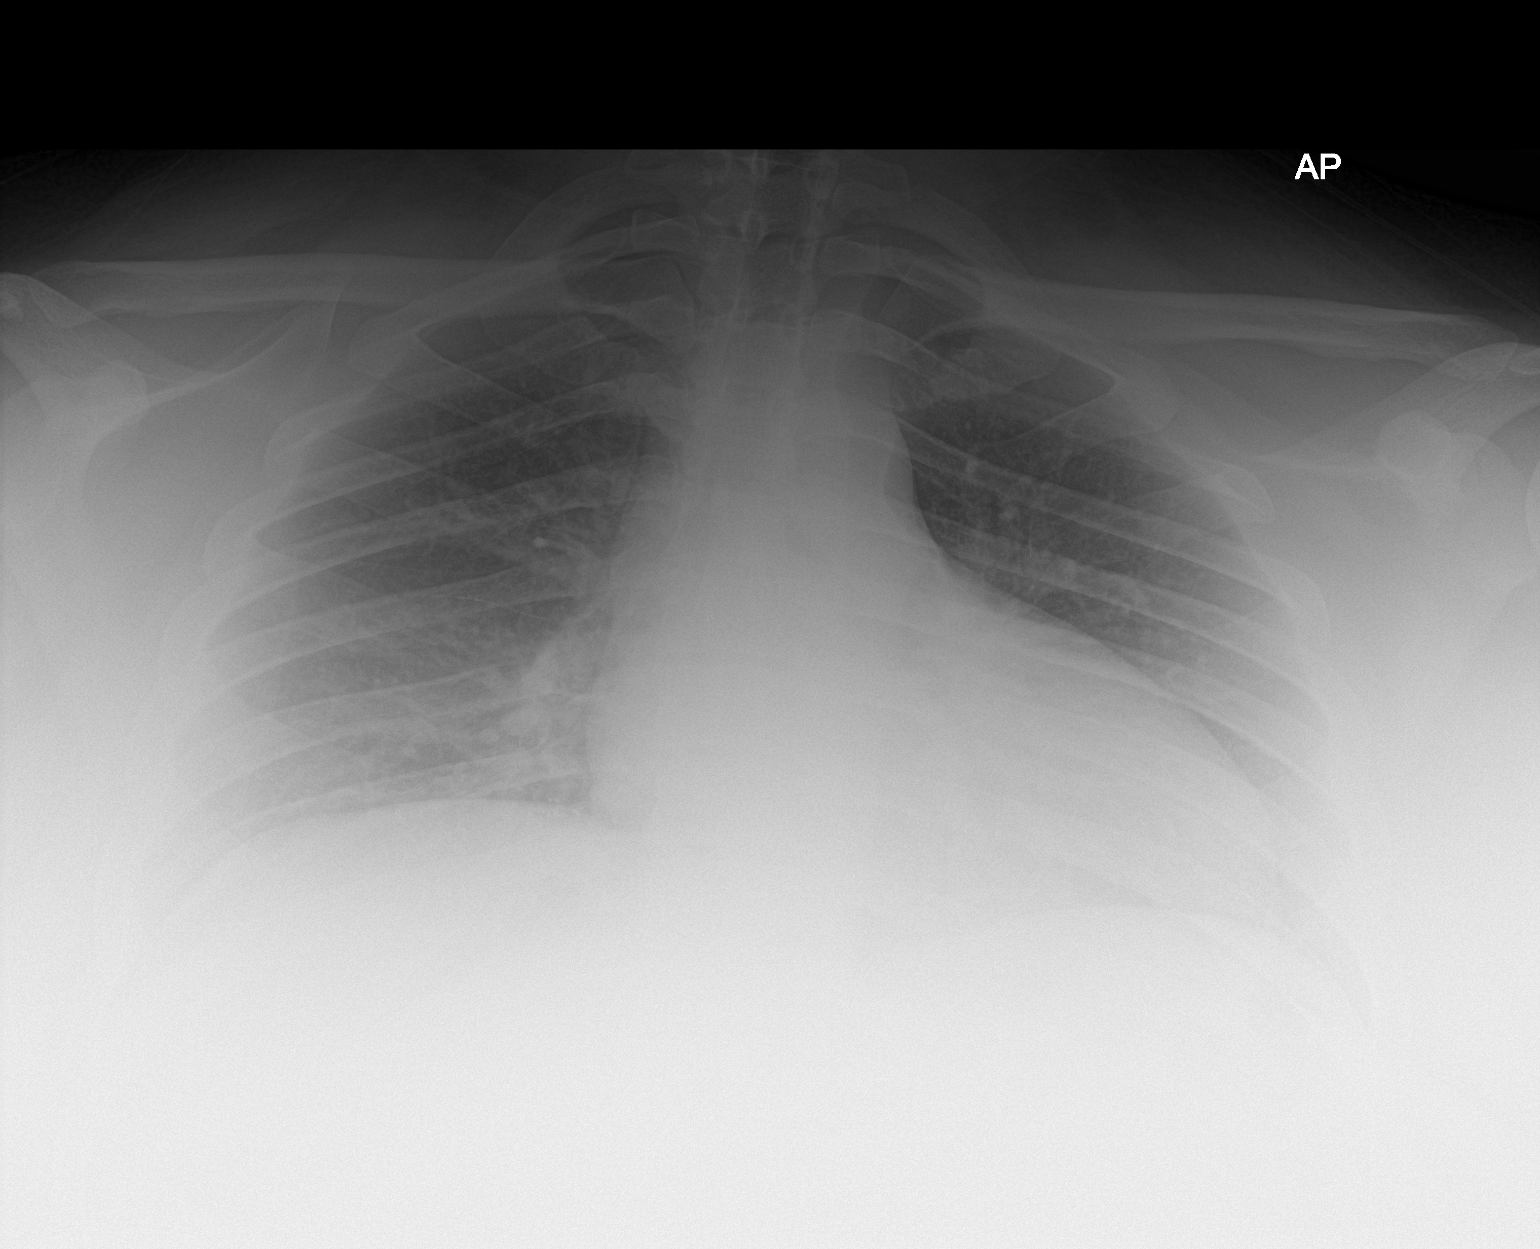

[1 of 1 positions shown; findings below may reference images not displayed]

FINDINGS: The heart size and mediastinal contours are within normal limits.
Lungs are suboptimally inflated. No pleural effusion or edema. No
airspace densities. The visualized skeletal structures are
unremarkable.
IMPRESSION: No active cardiopulmonary abnormalities.
